# Patient Record
Sex: Female | Born: 1990 | Race: White | Hispanic: No | Marital: Single | Smoking: Current every day smoker
Health system: Southern US, Community
[De-identification: ages and names within clinical notes are randomized; demographics above are authoritative.]

## PROBLEM LIST (undated history)

## (undated) ENCOUNTER — Ambulatory Visit: Admission: EM | Discharge status: Absent without leave | Payer: Medicaid Other

## (undated) DIAGNOSIS — J45909 Unspecified asthma, uncomplicated: Secondary | ICD-10-CM

## (undated) DIAGNOSIS — N390 Urinary tract infection, site not specified: Secondary | ICD-10-CM

## (undated) DIAGNOSIS — F419 Anxiety disorder, unspecified: Secondary | ICD-10-CM

## (undated) DIAGNOSIS — F4310 Post-traumatic stress disorder, unspecified: Secondary | ICD-10-CM

## (undated) DIAGNOSIS — F1111 Opioid abuse, in remission: Secondary | ICD-10-CM

## (undated) HISTORY — PX: FEMUR CLOSED REDUCTION: SHX939

---
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
## 2014-09-02 ENCOUNTER — Encounter (HOSPITAL_COMMUNITY): Payer: Self-pay | Admitting: Emergency Medicine

## 2014-09-02 ENCOUNTER — Emergency Department (HOSPITAL_COMMUNITY)
Admission: EM | Admit: 2014-09-02 | Discharge: 2014-09-03 | Disposition: A | Discharge status: Home / Self Care | Payer: Medicaid Other | Attending: Emergency Medicine | Admitting: Emergency Medicine

## 2014-09-02 DIAGNOSIS — Z880 Allergy status to penicillin: Secondary | ICD-10-CM | POA: Diagnosis not present

## 2014-09-02 DIAGNOSIS — R300 Dysuria: Secondary | ICD-10-CM | POA: Diagnosis present

## 2014-09-02 DIAGNOSIS — N390 Urinary tract infection, site not specified: Secondary | ICD-10-CM | POA: Diagnosis not present

## 2014-09-02 DIAGNOSIS — Z720 Tobacco use: Secondary | ICD-10-CM | POA: Insufficient documentation

## 2014-09-02 LAB — CBC WITH DIFFERENTIAL/PLATELET
BASOS ABS: 0 10*3/uL (ref 0.0–0.1)
BASOS PCT: 0 % (ref 0–1)
EOS ABS: 0.2 10*3/uL (ref 0.0–0.7)
Eosinophils Relative: 2 % (ref 0–5)
HCT: 36.9 % (ref 36.0–46.0)
Hemoglobin: 12.3 g/dL (ref 12.0–15.0)
Lymphocytes Relative: 31 % (ref 12–46)
Lymphs Abs: 2.7 10*3/uL (ref 0.7–4.0)
MCH: 31.2 pg (ref 26.0–34.0)
MCHC: 33.3 g/dL (ref 30.0–36.0)
MCV: 93.7 fL (ref 78.0–100.0)
Monocytes Absolute: 0.5 10*3/uL (ref 0.1–1.0)
Monocytes Relative: 5 % (ref 3–12)
NEUTROS ABS: 5.4 10*3/uL (ref 1.7–7.7)
NEUTROS PCT: 62 % (ref 43–77)
PLATELETS: 184 10*3/uL (ref 150–400)
RBC: 3.94 MIL/uL (ref 3.87–5.11)
RDW: 12.7 % (ref 11.5–15.5)
WBC: 8.7 10*3/uL (ref 4.0–10.5)

## 2014-09-02 LAB — URINE MICROSCOPIC-ADD ON

## 2014-09-02 LAB — URINALYSIS, ROUTINE W REFLEX MICROSCOPIC
Glucose, UA: NEGATIVE mg/dL
Hgb urine dipstick: NEGATIVE
Ketones, ur: 40 mg/dL — AB
Nitrite: POSITIVE — AB
PROTEIN: 30 mg/dL — AB
Specific Gravity, Urine: 1.023 (ref 1.005–1.030)
UROBILINOGEN UA: 4 mg/dL — AB (ref 0.0–1.0)
pH: 5 (ref 5.0–8.0)

## 2014-09-02 LAB — I-STAT CHEM 8, ED
BUN: 11 mg/dL (ref 6–23)
Calcium, Ion: 1.17 mmol/L (ref 1.12–1.23)
Chloride: 102 mmol/L (ref 96–112)
Creatinine, Ser: 0.7 mg/dL (ref 0.50–1.10)
Glucose, Bld: 93 mg/dL (ref 70–99)
HEMATOCRIT: 37 % (ref 36.0–46.0)
HEMOGLOBIN: 12.6 g/dL (ref 12.0–15.0)
POTASSIUM: 3.6 mmol/L (ref 3.5–5.1)
SODIUM: 140 mmol/L (ref 135–145)
TCO2: 20 mmol/L (ref 0–100)

## 2014-09-02 MED ORDER — ONDANSETRON HCL 4 MG/2ML IJ SOLN
4.0000 mg | Freq: Once | INTRAMUSCULAR | Status: AC
  Administered 2014-09-02: 4 mg via INTRAVENOUS
  Filled 2014-09-02: qty 2

## 2014-09-02 MED ORDER — SODIUM CHLORIDE 0.9 % IV SOLN
Freq: Once | INTRAVENOUS | Status: AC
  Administered 2014-09-02: via INTRAVENOUS

## 2014-09-02 MED ORDER — MORPHINE SULFATE 4 MG/ML IJ SOLN
4.0000 mg | Freq: Once | INTRAMUSCULAR | Status: AC
  Administered 2014-09-02: 4 mg via INTRAVENOUS
  Filled 2014-09-02: qty 1

## 2014-09-02 NOTE — ED Notes (Signed)
Pt states that she feels like she has had a UTI for a week and now she is having pain in her back and is fearful of a kidney infection. Alert and oriented.

## 2014-09-02 NOTE — ED Provider Notes (Signed)
CSN: [id number]     Arrival date & time 09/02/14  2206 History  This chart was scribed for non-physician practitioner, Earley FavorGail Shelda Truby, NP-C working with Devoria AlbeIva Knapp, MD, by Abel PrestoKara Demonbreun, ED Scribe. This patient was seen in room WA19/WA19 and the patient's care was started at 11:31 PM.      Chief Complaint  Patient presents with  . Dysuria     Patient is a 24 y.o. female presenting with dysuria. The history is provided by the patient. No language interpreter was used.  Dysuria Associated symptoms: no fever, no nausea, no vaginal discharge and no vomiting    HPI Comments: Jessica Mercer is a 24 y.o. female who presents to the Emergency Department complaining of dysuria with onset 1 week ago. Pt has been taking OTC Azo and drinking fluids for relief. Pt has developed associated back pain in last 24 hours. Pt denies nausea, diarrhea, constipation, vaginal bleeding, vaginal discharge, fever, and headache.   No past medical history on file. Past Surgical History  Procedure Laterality Date  . Femur closed reduction     No family history on file. History  Substance Use Topics  . Smoking status: Current Every Day Smoker  . Smokeless tobacco: Not on file  . Alcohol Use: Yes     Comment: occasionally   OB History    No data available     Review of Systems  Constitutional: Negative for fever.  Gastrointestinal: Negative for nausea, vomiting and diarrhea.  Genitourinary: Positive for dysuria and frequency. Negative for vaginal bleeding and vaginal discharge.  Neurological: Negative for headaches.  All other systems reviewed and are negative.     Allergies  Amoxicillin and Penicillins  Home Medications   Prior to Admission medications   Not on File   BP 107/74 mmHg  Pulse 79  Temp(Src) 98.4 F (36.9 C) (Oral)  Resp 16  SpO2 99%  LMP 08/19/2014 (Approximate) Physical Exam  Constitutional: She is oriented to person, place, and time. She appears well-developed and  well-nourished.  HENT:  Head: Normocephalic.  Eyes: Conjunctivae are normal.  Neck: Normal range of motion. Neck supple.  Cardiovascular: Normal rate, regular rhythm and normal heart sounds.  Exam reveals no friction rub.   No murmur heard. Pulmonary/Chest: Effort normal and breath sounds normal. No respiratory distress. She has no wheezes. She has no rales.  Abdominal: Soft. Bowel sounds are normal. There is CVA tenderness (bialterally).  Musculoskeletal: Normal range of motion.  Neurological: She is alert and oriented to person, place, and time.  Skin: Skin is warm and dry.  Psychiatric: She has a normal mood and affect. Her behavior is normal.  Nursing note and vitals reviewed.   ED Course  Procedures (including critical care time) DIAGNOSTIC STUDIES: Oxygen Saturation is 97% on room air, normal by my interpretation.    COORDINATION OF CARE: 11:31 PM Discussed treatment plan with patient at beside, the patient agrees with the plan and has no further questions at this time.   Labs Review Labs Reviewed  URINALYSIS, ROUTINE W REFLEX MICROSCOPIC  CBC WITH DIFFERENTIAL/PLATELET  I-STAT CHEM 8, ED    Imaging Review No results found.   EKG Interpretation None     Agents labs reviewed.  No elevated white count, normal kidney function.  She does have a urinary tract infection on her urine sample.  She will be started on sulfa due to her penicillin allergy as well as Pyridium for comfort control to be given a Pharmacist, hospitalresource list.  She doesn't  have a PCP MDM   Final diagnoses:  None    Will do IV fluids, I-Stat, CBC, and test kidney function due to new onset of CVA tenderness and with h/o of polynephritis    Earley Favor, NP 09/03/14 0104  Devoria Albe, MD 09/03/14 4098

## 2014-09-03 MED ORDER — FLUCONAZOLE 150 MG PO TABS
150.0000 mg | ORAL_TABLET | Freq: Once | ORAL | Status: AC
  Administered 2014-09-03: 150 mg via ORAL
  Filled 2014-09-03: qty 1

## 2014-09-03 MED ORDER — SULFAMETHOXAZOLE-TRIMETHOPRIM 800-160 MG PO TABS
1.0000 | ORAL_TABLET | Freq: Once | ORAL | Status: AC
  Administered 2014-09-03: 1 via ORAL
  Filled 2014-09-03: qty 1

## 2014-09-03 MED ORDER — PHENAZOPYRIDINE HCL 200 MG PO TABS: 200.0000 mg | ORAL_TABLET | Freq: Three times a day (TID) | ORAL | Status: AC

## 2014-09-03 MED ORDER — PHENAZOPYRIDINE HCL 200 MG PO TABS
200.0000 mg | ORAL_TABLET | Freq: Three times a day (TID) | ORAL | Status: DC
  Administered 2014-09-03: 200 mg via ORAL
  Filled 2014-09-03: qty 1

## 2014-09-03 MED ORDER — SULFAMETHOXAZOLE-TRIMETHOPRIM 800-160 MG PO TABS: 1.0000 | ORAL_TABLET | Freq: Two times a day (BID) | ORAL | Status: DC

## 2014-09-03 MED ORDER — TRAMADOL HCL 50 MG PO TABS
50.0000 mg | ORAL_TABLET | Freq: Four times a day (QID) | ORAL | Status: DC | PRN
Start: 2014-09-03 — End: 2015-09-25

## 2014-09-03 NOTE — Discharge Instructions (Signed)
Antibiotic Medication Antibiotic medicine helps fight germs. Germs cause infections. This type of medicine will not work for colds, flu, or other viral infections. Tell your doctor if you:  Are allergic to any medicines.  Are pregnant or are trying to get pregnant.  Are taking other medicines.  Have other medical problems. HOME CARE  Take your medicine with a glass of water or food as told by your doctor.  Take the medicine as told. Finish them even if you start to feel better.  Do not give your medicine to other people.  Do not use your medicine in the future for a different infection.  Ask your doctor about which side effects to watch for.  Try not to miss any doses. If you miss a dose, take it as soon as possible. If it is almost time for your next dose, and your dosing schedule is:  Two doses a day, take the missed dose and the next dose 5 to 6 hours later.  Three or more doses a day, take the missed dose and the next dose 2 to 4 hours later, or double your next dose.  Then go back to your normal schedule. GET HELP RIGHT AWAY IF:   You get worse or do not get better within a few days.  The medicine makes you sick.  You develop a rash or any other side effects.  You have questions or concerns. MAKE SURE YOU:  Understand these instructions.  Will watch your condition.  Will get help right away if you are not doing well or get worse. Document Released: 03/08/2008 Document Revised: 08/22/2011 Document Reviewed: 05/05/2009 [facility] Patient Information 2015 [place], [place]. This information is not intended to replace advice given to you by your health care provider. Make sure you discuss any questions you have with your health care provider. You do not have an elevated white count and your kidney function is normal.  You do have a urinary tract infection.  You  been given prescriptions for sulfa.  Please take this as directed until completed, as well as Pyridium, which  will make her urine orange color, but it will help with your discomfort    Emergency Department Resource Guide 1) Find a Doctor and Pay Out of Pocket Although you won't have to find out who is covered by your insurance plan, it is a good idea to ask around and get recommendations. You will then need to call the office and see if the doctor you have chosen will accept you as a new patient and what types of options they offer for patients who are self-pay. Some doctors offer discounts or will set up payment plans for their patients who do not have insurance, but you will need to ask so you aren't surprised when you get to your appointment.  2) Contact Your Local Health Department Not all health departments have doctors that can see patients for sick visits, but many do, so it is worth a call to see if yours does. If you don't know where your local health department is, you can check in your phone book. The CDC also has a tool to help you locate your state's health department, and many state websites also have listings of all of their local health departments.  3) Find a Walk-in Clinic If your illness is not likely to be very severe or complicated, you may want to try a walk in clinic. These are popping up all over the country in pharmacies, drugstores, and  shopping centers. They're usually staffed by nurse practitioners or physician assistants that have been trained to treat common illnesses and complaints. They're usually fairly quick and inexpensive. However, if you have serious medical issues or chronic medical problems, these are probably not your best option.  No Primary Care Doctor: - Call Health Connect at  [phone] - they can help you locate a primary care doctor that  accepts your insurance, provides certain services, etc. - Physician Referral Service- [phone]  Chronic Pain Problems: Organization         Address  Phone   Notes  [facility]  [phone]  Patients need to be referred by their primary care doctor.   Medication Assistance: Organization         Address  Phone   Notes  [facility] Medication [facility] [address] [place] [place], [place] [address] [phone] --Must be a resident of [facility] -- Must have NO insurance coverage whatsoever (no Medicaid/ Medicare, etc.) -- The pt. MUST have a primary care doctor that directs their care regularly and follows them in the community   MedAssist  [phone]   [employer]  [phone]    Agencies that provide inexpensive medical care: Organization         Address  Phone   Notes  [facility]  [phone]   [facility]    [phone]   [facility] [address] [place], [place] [address] [phone]   [facility] [place] [facility] [address] [place], [place] [phone]   Planned Parenthood    [phone]   [facility]    [phone]   [facility]  [address], [place] Phone:  [phone], Fax:  [phone] Hours of Operation:  9 am - 6 pm, M-F.  Also accepts Medicaid/Medicare and self-pay.  [facility]  [address], [place] Phone: [phone], Fax: [phone]. Hours of Operation:  8:30 am - 5:30 pm, M-F.  Also accepts Medicaid and self-pay.  [facility] [place] [address] [place] Phone: [phone]   [facility] [address] Natasha Bence [place], [place] [phone], Ext. [phone] Mondays & Thursdays: 7-9 AM.  First 15 patients are seen on a first come, first serve basis.    Medicaid-accepting [facility] Providers:  Organization         Address  Phone   Notes  [facility] [address], [place] [phone] Also accepts self-pay patients.  [facility] [address] Laurell Josephs [place], [place]  [phone]   [facility] [address], [place] [phone]   [facility] [address], [place] [phone]   Renaye Rakers [address], [place]   [phone] Only accepts [place] Access [place] patients after they have their name applied to their card.   Self-Pay (no insurance) in [facility]:  Organization         Address  Phone   Notes  Sickle Cell Patients, [facility] Internal Medicine [address] [place], [place] [phone]   [facility] [address] [place], [place] [phone]   [facility] [place]  [address], [place] [phone]  [facility]/Dr. Osei-Bonsu  [address] [place] or [address], [place] [phone] Phone number for both [place] and [place] locations is the same.  [facility]gent Medical and [facility] [address], [place] [phone]   [facility] [address], [place] or [address] [phone]   [facility] [address], [place] [phone], phone; [phone], fax Sees patients 1st and 3rd Saturday of every month.  Must not qualify for public or private insurance (i.e. Medicaid, Medicare, [place] Health Choice, Veterans' Benefits)  Household income should be no more than 200% of the poverty level The clinic cannot treat you if you are pregnant or think you are pregnant  Sexually transmitted diseases are not treated at the clinic.    Dental Care: Organization         Address  Phone  Notes  [facility] of [facility] [address] [place], [place] [phone] Accepts children up to age 70 who are enrolled in [place] or [place] Health Choice; pregnant women with a Medicaid card; and children who have applied for Medicaid or [place] Health Choice, but were  declined, whose parents can pay a reduced fee at time of service.  [facility] Department of [facility]  [address], [place] [phone] Accepts children up to age 37 who are enrolled in [place] or [place] Health Choice; pregnant women with a Medicaid card; and children who have applied for Medicaid or [place] Health Choice, but were declined, whose parents can pay a reduced fee at time of service.  [facility]  [address] [place], [place] [phone] Patients are seen by appointment only. Walk-ins are not accepted. [facility] will see patients 85 years of age and older. Monday - Tuesday (8am-5pm) Most Wednesdays (8:30-5pm) $30 per visit, cash only  [facility]  [address], [facility] [phone] Patients are seen by appointment only. Walk-ins are not accepted. [facility] will see patients 63 years of age and older. One Wednesday Evening (Monthly: Volunteer Based).  $30 per visit, cash only  [employer]  [phone] for adults; Children under age 81, call Graduate Pediatric Dentistry at [phone]. Children aged 30-14, please call [phone] to request a pediatric application.  Dental services are provided in all areas of dental care including fillings, crowns and bridges, complete and partial dentures, implants, gum treatment, root canals, and extractions. Preventive care is also provided. Treatment is provided to both adults and children. Patients are selected via a lottery and there is often a waiting list.   [facility] [address] [place]  [phone] [email or web address]   [facility] [address] [place], [place] [phone], Ext. [phone] Second and Fourth Thursday of each month, opens at 6:30 AM; Clinic ends at 9 AM.  Patients are seen on a first-come first-served basis, and a limited number are seen during each clinic.    [facility]  [address] Ether Griffins [place], [place] [phone]   Eligibility Requirements You must have lived in [place], [place], or [place] counties for at least the last three months.   You cannot be eligible for state or federal sponsored [place], including [employer], [place], or [employer].   You generally cannot be eligible for healthcare insurance  through your employer.    How to apply: Eligibility screenings are held every Tuesday and Wednesday afternoon from 1:00 pm until 4:00 pm. You do not need an appointment for the interview!  [facility] [address], [place], [place] [phone]   [facility]  [phone]   [facility] Health Department  [phone]   [facility] Health Department  [phone]    Behavioral Health Resources in the Community: Intensive Outpatient Programs Organization         Address  Phone  Notes  [facility] Services [address], [place], [place] [phone]   [facility] Outpatient [address] [place], [place] [phone]   [facility] [address], [place], [place]  [phone]   [facility] Mental Health [address],  [place], [place] [phone] or [phone]   Substance Abuse Resources Organization         Address  Phone  Notes  [facility]  [phone]   [facility]  [phone]   [facility]  [phone]   Floydene Flock  [phone]   Residential & Outpatient Substance Abuse Program  [phone]   Psychological Services Organization         Address  Phone  Notes  [facility]  [phone]   [facility] Services  [phone]   [facility] Mental Health [address] [place] [phone] or [phone]    Mobile Crisis Teams Organization         Address  Phone  Notes  [facility], Mobile Crisis Care  Unit  [phone]   [facility]  [address] [place], [place] [phone]   Doristine Locks [address] [place] [phone]    Self-Help/Support Groups Organization         Address  Phone             Notes  [facility] [place] - variety of support groups  336- I7437963 Call for more information  Narcotics Anonymous (NA), [facility] [address], [employer] [place]  2 meetings at this location   Statistician         Address  Phone  Notes  [facility] [address],    [place]  [phone]   [facility]  [address], [place] [address], [place], [place] [phone]   [facility] [address] [place], [place] [phone] Admissions: 8am-3pm M-F  [facility] [address],    [place], [place] [phone]   [facility] [address] [place], [place], [place] [phone]   [facility] [address]  [place], [place] [phone]   [facility] - Intensive Outpatient [address] Laurell Josephs [address], [place], [place] [phone]   [facility] ([facility] [address] [place]  [place], [place] [phone] or [phone]   [facility] ([facility]) [address] [place], [place] [phone] Accepts Medicaid  [facility] [address],  [place] [phone] Substance Abuse/Addiction Treatment   [facility] Organization         Address  Phone  Notes  [facility]  [phone]   Angie Fava, PhD [address] [place], [place]   3088747331 or [phone]   [facility]   [address] [place], [place] [phone]  [facility] [address], [place], [place] [phone]) [phone] Insurance/Medicaid/sponsorship through [employer]  [facility] [address]                                     [place], [place] [phone]) [phone] Therapy/tele-psych/case  [facility] [address]   [place], [place] [phone]    Dr. Lolly MustacheArfeen  [phone]   [facility] [place]  [employer] [facility] 1) [address], [place] 2) [address], [place] 3)  [address], [place] [phone]) [phone]) [phone]  [phone](336) [phone]   [facility] Child Abuse Hotline [phone](336) [phone] or [phone](336) [phone] (After Hours)

## 2015-02-07 ENCOUNTER — Emergency Department (HOSPITAL_BASED_OUTPATIENT_CLINIC_OR_DEPARTMENT_OTHER)
Admission: EM | Admit: 2015-02-07 | Discharge: 2015-02-07 | Disposition: A | Discharge status: Home / Self Care | Payer: Medicaid Other | Attending: Physician Assistant | Admitting: Physician Assistant

## 2015-02-07 ENCOUNTER — Encounter (HOSPITAL_BASED_OUTPATIENT_CLINIC_OR_DEPARTMENT_OTHER): Payer: Self-pay | Admitting: *Deleted

## 2015-02-07 DIAGNOSIS — Z880 Allergy status to penicillin: Secondary | ICD-10-CM | POA: Insufficient documentation

## 2015-02-07 DIAGNOSIS — Z792 Long term (current) use of antibiotics: Secondary | ICD-10-CM | POA: Diagnosis not present

## 2015-02-07 DIAGNOSIS — R300 Dysuria: Secondary | ICD-10-CM | POA: Diagnosis present

## 2015-02-07 DIAGNOSIS — Z79899 Other long term (current) drug therapy: Secondary | ICD-10-CM | POA: Diagnosis not present

## 2015-02-07 DIAGNOSIS — M545 Low back pain, unspecified: Secondary | ICD-10-CM

## 2015-02-07 DIAGNOSIS — Z87440 Personal history of urinary (tract) infections: Secondary | ICD-10-CM | POA: Insufficient documentation

## 2015-02-07 DIAGNOSIS — Z720 Tobacco use: Secondary | ICD-10-CM | POA: Insufficient documentation

## 2015-02-07 DIAGNOSIS — Z3202 Encounter for pregnancy test, result negative: Secondary | ICD-10-CM | POA: Diagnosis not present

## 2015-02-07 DIAGNOSIS — F419 Anxiety disorder, unspecified: Secondary | ICD-10-CM | POA: Diagnosis not present

## 2015-02-07 HISTORY — DX: Urinary tract infection, site not specified: N39.0

## 2015-02-07 HISTORY — DX: Anxiety disorder, unspecified: F41.9

## 2015-02-07 HISTORY — DX: Post-traumatic stress disorder, unspecified: F43.10

## 2015-02-07 LAB — URINALYSIS, ROUTINE W REFLEX MICROSCOPIC
Bilirubin Urine: NEGATIVE
Glucose, UA: NEGATIVE mg/dL
Hgb urine dipstick: NEGATIVE
KETONES UR: NEGATIVE mg/dL
LEUKOCYTES UA: NEGATIVE
NITRITE: NEGATIVE
PH: 5.5 (ref 5.0–8.0)
PROTEIN: NEGATIVE mg/dL
Specific Gravity, Urine: 1.024 (ref 1.005–1.030)
Urobilinogen, UA: 0.2 mg/dL (ref 0.0–1.0)

## 2015-02-07 LAB — PREGNANCY, URINE: Preg Test, Ur: NEGATIVE

## 2015-02-07 MED ORDER — OXYCODONE-ACETAMINOPHEN 5-325 MG PO TABS
1.0000 | ORAL_TABLET | Freq: Once | ORAL | Status: AC
Start: 2015-02-07 — End: 2015-02-07
  Administered 2015-02-07: 1 via ORAL
  Filled 2015-02-07: qty 1

## 2015-02-07 NOTE — ED Notes (Signed)
Reports hx of frequent UTIs. Has been having sx since 8/5. Saw PCP on 8/18 and was started on cipro- states sx did not resolve so saw PCP yesterday and was given Rx for bactrim which she has not been able to fill yet- pt reports flank pain and left side abd pain

## 2015-02-07 NOTE — Discharge Instructions (Signed)

## 2015-02-07 NOTE — ED Notes (Signed)
Pt called for triage- in bathroom at this time

## 2015-02-07 NOTE — ED Provider Notes (Signed)
CSN: [id number]     Arrival date & time 02/07/15  2047 History  This chart was scribed for Keilee Denman Randall An, MD by [name], ED Scribe. This patient was seen in room MH11/MH11 and the patient's care was started at 9:37 PM.    Chief Complaint  Patient presents with  . Dysuria  . Flank Pain   The history is provided by the patient. No language interpreter was used.   HPI Comments: Jessica Mercer is a 24 y.o. female with a PMHx of frequent UTIs who presents to the Emergency Department complaining of dysuria and constant, stabbing bilateral flank pain onset 8/5. She notes that the pain to the left flank is worse than the right. She reports associated cramping, left-sided groin pain onset this morning. Pt was seen for the same by her PCP on 8/18, when she was prescribed Cipro. She returned to the PCP 1 day ago, as the medication had provided no relief or improvement of the symptoms. She was then prescribed bactrim, but states that she has not been able to fill the prescription yet. She states her PCP is having yesterday's urine sample to be tested for STDs and will refer her to a urologist if pains continue. She notes she takes ibuprofen every day for HA, with no relief to the pain. Pt denies chunky discharge.  Past Medical History  Diagnosis Date  . Frequent UTI   . Anxiety   . PTSD (post-traumatic stress disorder)    Past Surgical History  Procedure Laterality Date  . Femur closed reduction     No family history on file. Social History  Substance Use Topics  . Smoking status: Current Every Day Smoker    Types: Cigarettes  . Smokeless tobacco: Never Used  . Alcohol Use: Yes     Comment: 2 beer/ week   OB History    No data available     Review of Systems  Gastrointestinal: Positive for abdominal pain.  Genitourinary: Positive for dysuria and flank pain. Negative for vaginal discharge.  All other systems reviewed and are negative.   Allergies  Amoxicillin and  Penicillins  Home Medications   Prior to Admission medications   Medication Sig Start Date End Date Taking? Authorizing Provider  ALPRAZolam Prudy Feeler) 1 MG tablet Take 0.5-1 mg by mouth 2 (two) times daily.  08/14/14  Yes Historical Provider, MD  PREVIDENT 5000 BOOSTER PLUS 1.1 % PSTE Take 1 application by mouth 3 (three) times daily as needed (for teeth enamel).  08/14/14   Historical Provider, MD  sulfamethoxazole-trimethoprim (BACTRIM DS,SEPTRA DS) 800-160 MG per tablet Take 1 tablet by mouth 2 (two) times daily. 09/03/14   Earley Favor, NP  traMADol (ULTRAM) 50 MG tablet Take 1 tablet (50 mg total) by mouth every 6 (six) hours as needed. 09/03/14   Earley Favor, NP  TRI-PREVIFEM 0.18/0.215/0.25 MG-35 MCG tablet Take 1 tablet by mouth daily. 08/12/14   Historical Provider, MD   BP 122/86 mmHg  Pulse 85  Temp(Src) 98.2 F (36.8 C) (Oral)  Resp 18  Ht  (1.575 m)  Wt 138 lb (62.596 kg)  BMI 25.23 kg/m2  SpO2 100%  LMP 01/11/2015 Physical Exam  Constitutional: She is oriented to person, place, and time. She appears well-developed and well-nourished.  HENT:  Head: Normocephalic and atraumatic.  Eyes: Conjunctivae are normal. Right eye exhibits no discharge. Left eye exhibits no discharge.  Pulmonary/Chest: Effort normal. No respiratory distress.  Abdominal: Soft. There is no tenderness.  Musculoskeletal: She  exhibits no tenderness.  No CVA TTP  Neurological: She is alert and oriented to person, place, and time. Coordination normal.  Skin: Skin is warm and dry. No rash noted. She is not diaphoretic. No erythema.  Psychiatric: She has a normal mood and affect.  Nursing note and vitals reviewed.   ED Course  Procedures  DIAGNOSTIC STUDIES: Oxygen Saturation is 100% on RA, normal by my interpretation.    COORDINATION OF CARE: 9:46 PM - Discussed normal urinalysis results and possible STD or small kidney stones. Discussed plans to give a note for work and order a dose of percocet. Pt  advised of plan for treatment and pt agrees.  Labs Review Labs Reviewed  URINALYSIS, ROUTINE W REFLEX MICROSCOPIC (NOT AT [facility])  PREGNANCY, URINE    Imaging Review No results found. I have personally reviewed and evaluated these images and lab results as part of my medical decision-making.   EKG Interpretation None      MDM   Final diagnoses:  None    Patient is a 24 year old female presenting with back pain. She is requesting pain medication. She states that she was given Percocet by her primary care doctor last week and she ran out and she requested more and patient's primary care provider only gave her ibuprofen. She states that she is sure that her kidney hurts because of infection. Patient has no evidence of urinary tract infection. Patient's urine already sent for GC chlamydia by her primary care provider. We talked about other etiologies of back pain including kidney stone. However she looks so well on exam and has no tenderness and has normal vital signs I do not believe that she has a obstructive stone at this time. We will have her follow-up with her primary care provider as planned. We will give her 1 Percocet here to be comfortable but will not be  prescribing any narcotics at this time.  I personally performed the services described in this documentation, which was scribed in my presence. The recorded information has been reviewed and is accurate.   Dariella Gillihan Randall An, MD 02/07/15 2311

## 2015-06-15 ENCOUNTER — Emergency Department (HOSPITAL_COMMUNITY)
Admission: EM | Admit: 2015-06-15 | Discharge: 2015-06-15 | Disposition: A | Discharge status: Home / Self Care | Payer: Medicaid Other | Attending: Emergency Medicine | Admitting: Emergency Medicine

## 2015-06-15 ENCOUNTER — Encounter (HOSPITAL_COMMUNITY): Payer: Self-pay

## 2015-06-15 ENCOUNTER — Emergency Department (HOSPITAL_COMMUNITY): Payer: Medicaid Other

## 2015-06-15 DIAGNOSIS — M79652 Pain in left thigh: Secondary | ICD-10-CM

## 2015-06-15 DIAGNOSIS — W1839XA Other fall on same level, initial encounter: Secondary | ICD-10-CM | POA: Insufficient documentation

## 2015-06-15 DIAGNOSIS — Z79818 Long term (current) use of other agents affecting estrogen receptors and estrogen levels: Secondary | ICD-10-CM | POA: Insufficient documentation

## 2015-06-15 DIAGNOSIS — Y998 Other external cause status: Secondary | ICD-10-CM | POA: Insufficient documentation

## 2015-06-15 DIAGNOSIS — F419 Anxiety disorder, unspecified: Secondary | ICD-10-CM | POA: Insufficient documentation

## 2015-06-15 DIAGNOSIS — S79912A Unspecified injury of left hip, initial encounter: Secondary | ICD-10-CM | POA: Insufficient documentation

## 2015-06-15 DIAGNOSIS — M25552 Pain in left hip: Secondary | ICD-10-CM

## 2015-06-15 DIAGNOSIS — W19XXXA Unspecified fall, initial encounter: Secondary | ICD-10-CM

## 2015-06-15 DIAGNOSIS — Z792 Long term (current) use of antibiotics: Secondary | ICD-10-CM | POA: Insufficient documentation

## 2015-06-15 DIAGNOSIS — Y9389 Activity, other specified: Secondary | ICD-10-CM | POA: Insufficient documentation

## 2015-06-15 DIAGNOSIS — S79922A Unspecified injury of left thigh, initial encounter: Secondary | ICD-10-CM | POA: Insufficient documentation

## 2015-06-15 DIAGNOSIS — Y9289 Other specified places as the place of occurrence of the external cause: Secondary | ICD-10-CM | POA: Insufficient documentation

## 2015-06-15 DIAGNOSIS — Z9889 Other specified postprocedural states: Secondary | ICD-10-CM | POA: Insufficient documentation

## 2015-06-15 DIAGNOSIS — G8929 Other chronic pain: Secondary | ICD-10-CM | POA: Insufficient documentation

## 2015-06-15 DIAGNOSIS — Z880 Allergy status to penicillin: Secondary | ICD-10-CM | POA: Insufficient documentation

## 2015-06-15 DIAGNOSIS — F17210 Nicotine dependence, cigarettes, uncomplicated: Secondary | ICD-10-CM | POA: Insufficient documentation

## 2015-06-15 DIAGNOSIS — Z87440 Personal history of urinary (tract) infections: Secondary | ICD-10-CM | POA: Insufficient documentation

## 2015-06-15 DIAGNOSIS — Z79899 Other long term (current) drug therapy: Secondary | ICD-10-CM | POA: Insufficient documentation

## 2015-06-15 DIAGNOSIS — M25562 Pain in left knee: Secondary | ICD-10-CM

## 2015-06-15 NOTE — ED Notes (Signed)
Patient jhere with ongoing left leg pain. Patient states that she thinks it is related to the screws she still has in femur from old injury. Has been seen for same by other MD;s per patient

## 2015-06-15 NOTE — ED Notes (Signed)
PA at bedside updating patient.
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
## 2015-06-15 NOTE — Discharge Instructions (Signed)
Joint Pain °Joint pain, which is also called arthralgia, can be caused by many things. Joint pain often goes away when you follow your health care provider's instructions for relieving pain at home. However, joint pain can also be caused by conditions that require further treatment. Common causes of joint pain include: °· Bruising in the area of the joint. °· Overuse of the joint. °· Wear and tear on the joints that occur with aging (osteoarthritis). °· Various other forms of arthritis. °· A buildup of a crystal form of uric acid in the joint (gout). °· Infections of the joint (septic arthritis) or of the bone (osteomyelitis). °Your health care provider may recommend medicine to help with the pain. If your joint pain continues, additional tests may be needed to diagnose your condition. °HOME CARE INSTRUCTIONS °Watch your condition for any changes. Follow these instructions as directed to lessen the pain that you are feeling. °· Take medicines only as directed by your health care provider. °· Rest the affected area for as long as your health care provider says that you should. If directed to do so, raise the painful joint above the level of your heart while you are sitting or lying down. °· Do not do things that cause or worsen pain. °· If directed, apply ice to the painful area: °¨ Put ice in a plastic bag. °¨ Place a towel between your skin and the bag. °¨ Leave the ice on for 20 minutes, 2-3 times per day. °· Wear an elastic bandage, splint, or sling as directed by your health care provider. Loosen the elastic bandage or splint if your fingers or toes become numb and tingle, or if they turn cold and blue. °· Begin exercising or stretching the affected area as directed by your health care provider. Ask your health care provider what types of exercise are safe for you. °· Keep all follow-up visits as directed by your health care provider. This is important. °SEEK MEDICAL CARE IF: °· Your pain increases, and medicine  does not help. °· Your joint pain does not improve within 3 days. °· You have increased bruising or swelling. °· You have a fever. °· You lose 10 lb (4.5 kg) or more without trying. °SEEK IMMEDIATE MEDICAL CARE IF: °· You are not able to move the joint. °· Your fingers or toes become numb or they turn cold and blue. °  °This information is not intended to replace advice given to you by your health care provider. Make sure you discuss any questions you have with your health care provider. °  °Document Released: 05/30/2005 Document Revised: 06/20/2014 Document Reviewed: 03/11/2014 °Elsevier Interactive Patient Education ©2016 Elsevier Inc. ° ° °Emergency Department Resource Guide °1) Find a Doctor and Pay Out of Pocket °Although you won't have to find out who is covered by your insurance plan, it is a good idea to ask around and get recommendations. You will then need to call the office and see if the doctor you have chosen will accept you as a new patient and what types of options they offer for patients who are self-pay. Some doctors offer discounts or will set up payment plans for their patients who do not have insurance, but you will need to ask so you aren't surprised when you get to your appointment. ° °2) Contact Your Local Health Department °Not all health departments have doctors that can see patients for sick visits, but many do, so it is worth a call to see if yours   does. If you don't know where your local health department is, you can check in your phone book. The CDC also has a tool to help you locate your state's health department, and many state websites also have listings of all of their local health departments. ° °3) Find a Walk-in Clinic °If your illness is not likely to be very severe or complicated, you may want to try a walk in clinic. These are popping up all over the country in pharmacies, drugstores, and shopping centers. They're usually staffed by nurse practitioners or physician assistants  that have been trained to treat common illnesses and complaints. They're usually fairly quick and inexpensive. However, if you have serious medical issues or chronic medical problems, these are probably not your best option. ° °No Primary Care Doctor: °- Call [facility] at  832-8000 - they can help you locate a primary care doctor that  accepts your insurance, provides certain services, etc. °- Physician Referral Service- 1-800-533-3463 ° °Chronic Pain Problems: °Organization         Address  Phone   Notes  °[facility]  (336) 297-2271 Patients need to be referred by their primary care doctor.  ° °Medication Assistance: °Organization         Address  Phone   Notes  °[place] [facility] [address] °[place], [place] [address] (336) 641-8030 --Must be a resident of [place] °-- Must have NO insurance coverage whatsoever (no Medicaid/ Medicare, etc.) °-- The pt. MUST have a primary care doctor that directs their care regularly and follows them in the community °  °[facility]  (866) 331-1348   °[employer]  (888) 892-1162   ° °Agencies that provide inexpensive medical care: °Organization         Address  Phone   Notes  °[facility]  (336) 832-8035   °[facility]    (336) 832-7272   °[facility] [address] °[place], [place] [address] (336) 832-4777   °[facility] [address], °[place] (336) 271-4999   °[facility]    (336) 373-0678   °[facility]    (336) 272-1050   °[facility] ° [address], [place] Phone:  (336) 832-4444, Fax:  (336) 832-4440 Hours of Operation:  9 am - 6 pm, M-F.  Also accepts Medicaid/Medicare and self-pay.  °[facility] ° [address], [place] Phone: (336) 832-3150, Fax: (336) [phone]. Hours of Operation:  8:30 am - 5:30 pm, M-F.  Also accepts Medicaid  and self-pay.  °[facility] [place] [address], [place] Phone: (336) 878-6027   °[facility] [address], [place], [place] (336)723-1848, Ext. 123 Mondays & Thursdays: 7-9 AM.  First 15 patients are seen on a first come, first serve basis. °  ° °Medicaid-accepting [place] Providers: ° °Organization         Address  Phone   Notes  °[facility] [address], [place] (336) 641-2100 Also accepts self-pay patients.  °[facility] [address], [place] ° (336) 856-9996   °[facility] [address], [place] (336) 288-8857   °[facility] [address] [place] [address], [place] (336) 299-7000   °Veita Bland [address], [place]  ° (336) 373-1557   Only accepts [place] Access Medicaid patients after they have their name applied to their card.  ° °Self-Pay (no insurance) in [place]: ° °Organization         Address  Phone   Notes  °Sickle Cell Patients, [facility] [address], [place] (336) 832-1970   °[facility] [address], [place] (336) 832-4400   ° [facility] ° [address], [place] (336) 992-4800   °[facility]/Dr. Osei-Bonsu ° [address] [place] [address], [place] or [address], [place] (336) 841-8500 Phone number for both [place] and [place] locations is the same.  °[facility] [address], [place] (336) 299-0000   °[facility] [address] [place] [address], [place] or [address] (336) 852-7530 °(336) 878-2260   °[facility] [address], [place] (336) 350-1642, phone; (336) 294-5005, fax Sees patients 1st and 3rd Saturday of every month.  Must not qualify for public or private insurance (i.e. Medicaid, Medicare, [place] Health Choice, Veterans' Benefits) • Household income  should be no more than 200% of the poverty level •The clinic cannot treat you if you are pregnant or think you are pregnant • Sexually transmitted diseases are not treated at the clinic.  ° ° °Dental Care: °Organization         Address  Phone  Notes  °[facility] [address], [place] (336) 641-6152 Accepts children up to age 21 who are enrolled in Medicaid or [place] Health Choice; pregnant women with a Medicaid card; and children who have applied for Medicaid or [place] Health Choice, but were declined, whose parents can pay a reduced fee at time of service.  °[facility] [place]  [address], [place] (336) 641-7733 Accepts children up to age 21 who are enrolled in Medicaid or [place] Health Choice; pregnant women with a Medicaid card; and children who have applied for Medicaid or [place] Health Choice, but were declined, whose parents can pay a reduced fee at time of service.  °[facility] ° [address], [place] (336) 641-4533 Patients are seen by appointment only. Walk-ins are not accepted. [facility] will see patients 18 years of age and older. °Monday - Tuesday (8am-5pm) °Most Wednesdays (8:30-5pm) °$30 per visit, cash only  °[facility] ° [address], [place] (336) 641-4533 Patients are seen by appointment only. Walk-ins are not accepted. [facility] will see patients 18 years of age and older. °One Wednesday Evening (Monthly: Volunteer Based).  $30 per visit, cash only  °[facility]  (919) 537-3737 for adults; Children under age 4, call Graduate Pediatric Dentistry at ([phone]) [phone]. Children aged 4-14, please call (919) [phone] to request a pediatric application. ° Dental services are provided in all areas of dental care including fillings, crowns and bridges, complete and partial dentures, implants, gum treatment,  root canals, and extractions. Preventive care is also provided. Treatment is provided to both adults and children. °Patients are selected via a lottery and there is often a waiting list. °  °[facility] [address], ° ° (336) 763-8833 [email or web address] °  °[facility] [address], [place],  (336)723-1848, Ext. 123 Second and Fourth Thursday of each month, opens at 6:30 AM; Clinic   ends at 9 AM.  Patients are seen on a first-come first-served basis, and a limited number are seen during each clinic.  ° °[facility] ° [address], [place], [place] (336) 723-7904   Eligibility Requirements °You must have lived in [place], [place], or [place] counties for at least the last three months. °  You cannot be eligible for state or federal sponsored healthcare insurance, including Veterans Administration, Medicaid, or Medicare. °  You generally cannot be eligible for healthcare insurance through your employer.  °  How to apply: °Eligibility screenings are held every Tuesday and Wednesday afternoon from 1:00 pm until 4:00 pm. You do not need an appointment for the interview!  °[facility] [address], [place], [place] 336-631-2330   °[facility]  336-342-8273   °[place] [facility]  336-703-3100   °[facility]  336-570-6415   ° °Behavioral Health Resources in the Community: °Intensive Outpatient Programs °Organization         Address  Phone  Notes  °[facility] [address], [place], [place] 336-878-6098   °[facility] [address], [place], [place] 336-832-9800   °[facility] [address], [place], [place] ° 336-882-2125   °[facility] [address],  °[place], [place] 1-800-853-5163 or 336-641-4981   °Substance Abuse Resources °Organization         Address  Phone  Notes  °[facility]   336-882-2125   °[facility]  336-784-9470   °[facility]  336-285-9073   °[facility]  336-845-3988   °Residential & Outpatient Substance Abuse Program  1-800-659-3381   °Psychological Services °Organization         Address  Phone  Notes  ° Health  336- 832-9600   °[facility]  336- 378-7881   °[facility] [address], [place] 1-800-853-5163 or 336-641-4981   ° °Mobile Crisis Teams °Organization         Address  Phone  Notes  °[facility], Mobile Crisis Care Unit  1-877-626-1772   °Assertive °[facility] ° [address] [place], [place] 336-834-9664   °Sharon DeEsch [address] °[place] 336-554-5454   ° °Self-Help/Support Groups °Organization         Address  Phone             Notes  °[facility] [place] - variety of support groups  336- 373-1402 Call for more information  °Narcotics Anonymous (NA), [facility] [address], °[place]  2 meetings at this location  ° °Residential Treatment Programs °Organization         Address  Phone  Notes  °[facility] [address],    °[place]  1-866-801-8205   °[facility] ° [address], [place], [place] 704-293-8524   °[facility] [address], [place] 336-845-3988 Admissions: 8am-3pm M-F  °[facility] [address]    °[place], [place] 336-841-1104   °[facility] [address], [place], [place] 336-379-7146   °[facility] [address]  °[place], [place] 336-285-9073   °[facility] Intensive Outpatient [address], [place], [place] 336-852-3033   °[facility] [address]  °[place],  1-877-615-2722 or 336-784-9470   °[facility] [address] [place], [place] 336-227-7417 Accepts Medicaid  °[facility] [address]  °[place] 1-800-659-3381 Substance Abuse/Addiction Treatment  ° °[place] Behavioral Health Resources °Organization         Address  Phone  Notes  °[facility]  (888) 581-9988   °Julie Brannon, PhD [address] [place], [place]   (336) 349-5553 or (336) 951-0000   °[facility]   [address] °[place], [place] (336) 349-4454   °[facility] [address], [place], [place] (336) 342-8316 Insurance/Medicaid/sponsorship through [facility]  °[facility] [address]                                    [place], [place] (336) 342-8316 Therapy/tele-psych/case  °[facility] [address]  ° [place], [place] (336) 349-2233    °Dr. Arfeen  (336) 349-4544   °[facility] [place]  [employer] [place] [facility] 1) [address], [place] °2) [address], [place] °3)  [address], [place] (336) 349-3220 °(336) 342-7768 ° °(336) 342-8140   °[place] [facility] (336) 342-1394 or (336) 342-3537 (After Hours)    ° ° ° °

## 2015-06-15 NOTE — ED Notes (Signed)
Pt able to ambulate independently 

## 2015-07-07 ENCOUNTER — Encounter (HOSPITAL_BASED_OUTPATIENT_CLINIC_OR_DEPARTMENT_OTHER): Payer: Self-pay

## 2015-07-07 ENCOUNTER — Emergency Department (HOSPITAL_BASED_OUTPATIENT_CLINIC_OR_DEPARTMENT_OTHER)
Admission: EM | Admit: 2015-07-07 | Discharge: 2015-07-07 | Disposition: A | Discharge status: Home / Self Care | Payer: Medicaid Other | Attending: Emergency Medicine | Admitting: Emergency Medicine

## 2015-07-07 DIAGNOSIS — J069 Acute upper respiratory infection, unspecified: Secondary | ICD-10-CM

## 2015-07-07 DIAGNOSIS — Z7951 Long term (current) use of inhaled steroids: Secondary | ICD-10-CM | POA: Insufficient documentation

## 2015-07-07 DIAGNOSIS — Z872 Personal history of diseases of the skin and subcutaneous tissue: Secondary | ICD-10-CM | POA: Insufficient documentation

## 2015-07-07 DIAGNOSIS — F17210 Nicotine dependence, cigarettes, uncomplicated: Secondary | ICD-10-CM | POA: Insufficient documentation

## 2015-07-07 DIAGNOSIS — Z79899 Other long term (current) drug therapy: Secondary | ICD-10-CM | POA: Insufficient documentation

## 2015-07-07 DIAGNOSIS — Z87440 Personal history of urinary (tract) infections: Secondary | ICD-10-CM | POA: Insufficient documentation

## 2015-07-07 DIAGNOSIS — M791 Myalgia: Secondary | ICD-10-CM | POA: Insufficient documentation

## 2015-07-07 DIAGNOSIS — Z880 Allergy status to penicillin: Secondary | ICD-10-CM | POA: Insufficient documentation

## 2015-07-07 MED ORDER — BENZONATATE 100 MG PO CAPS: 100.0000 mg | ORAL_CAPSULE | Freq: Three times a day (TID) | ORAL | Status: DC

## 2015-07-07 MED ORDER — FLUTICASONE PROPIONATE 50 MCG/ACT NA SUSP: NASAL | Status: DC

## 2015-07-07 MED ORDER — HYDROCODONE-ACETAMINOPHEN 5-325 MG PO TABS
1.0000 | ORAL_TABLET | Freq: Once | ORAL | Status: AC
Start: 2015-07-07 — End: 2015-07-07
  Administered 2015-07-07: 1 via ORAL
  Filled 2015-07-07: qty 1

## 2015-07-07 NOTE — ED Notes (Signed)
Recheck temp 98.4 orally and pt states she has a metallic taste in mouth.

## 2015-07-07 NOTE — Discharge Instructions (Signed)
Upper Respiratory Infection, Adult Most upper respiratory infections (URIs) are a viral infection of the air passages leading to the lungs. A URI affects the nose, throat, and upper air passages. The most common type of URI is nasopharyngitis and is typically referred to as "the common cold." URIs run their course and usually go away on their own. Most of the time, a URI does not require medical attention, but sometimes a bacterial infection in the upper airways can follow a viral infection. This is called a secondary infection. Sinus and middle ear infections are common types of secondary upper respiratory infections. Bacterial pneumonia can also complicate a URI. A URI can worsen asthma and chronic obstructive pulmonary disease (COPD). Sometimes, these complications can require emergency medical care and may be life threatening.  CAUSES Almost all URIs are caused by viruses. A virus is a type of germ and can spread from one person to another.  RISKS FACTORS You may be at risk for a URI if:   You smoke.   You have chronic heart or lung disease.  You have a weakened defense (immune) system.   You are very young or very old.   You have nasal allergies or asthma.  You work in crowded or poorly ventilated areas.  You work in health care facilities or schools. SIGNS AND SYMPTOMS  Symptoms typically develop 2-3 days after you come in contact with a cold virus. Most viral URIs last 7-10 days. However, viral URIs from the influenza virus (flu virus) can last 14-18 days and are typically more severe. Symptoms may include:   Runny or stuffy (congested) nose.   Sneezing.   Cough.   Sore throat.   Headache.   Fatigue.   Fever.   Loss of appetite.   Pain in your forehead, behind your eyes, and over your cheekbones (sinus pain).  Muscle aches.  DIAGNOSIS  Your health care provider may diagnose a URI by:  Physical exam.  Tests to check that your symptoms are not due to  another condition such as:  Strep throat.  Sinusitis.  Pneumonia.  Asthma. TREATMENT  A URI goes away on its own with time. It cannot be cured with medicines, but medicines may be prescribed or recommended to relieve symptoms. Medicines may help:  Reduce your fever.  Reduce your cough.  Relieve nasal congestion. HOME CARE INSTRUCTIONS   Take medicines only as directed by your health care provider.   Gargle warm saltwater or take cough drops to comfort your throat as directed by your health care provider.  Use a warm mist humidifier or inhale steam from a shower to increase air moisture. This may make it easier to breathe.  Drink enough fluid to keep your urine clear or pale yellow.   Eat soups and other clear broths and maintain good nutrition.   Rest as needed.   Return to work when your temperature has returned to normal or as your health care provider advises. You may need to stay home longer to avoid infecting others. You can also use a face mask and careful hand washing to prevent spread of the virus.  Increase the usage of your inhaler if you have asthma.   Do not use any tobacco products, including cigarettes, chewing tobacco, or electronic cigarettes. If you need help quitting, ask your health care provider. PREVENTION  The best way to protect yourself from getting a cold is to practice good hygiene.   Avoid oral or hand contact with people with cold   symptoms.   Wash your hands often if contact occurs.  There is no clear evidence that vitamin C, vitamin E, echinacea, or exercise reduces the chance of developing a cold. However, it is always recommended to get plenty of rest, exercise, and practice good nutrition.  SEEK MEDICAL CARE IF:   You are getting worse rather than better.   Your symptoms are not controlled by medicine.   You have chills.  You have worsening shortness of breath.  You have brown or red mucus.  You have yellow or brown nasal  discharge.  You have pain in your face, especially when you bend forward.  You have a fever.  You have swollen neck glands.  You have pain while swallowing.  You have white areas in the back of your throat. SEEK IMMEDIATE MEDICAL CARE IF:   You have severe or persistent:  Headache.  Ear pain.  Sinus pain.  Chest pain.  You have chronic lung disease and any of the following:  Wheezing.  Prolonged cough.  Coughing up blood.  A change in your usual mucus.  You have a stiff neck.  You have changes in your:  Vision.  Hearing.  Thinking.  Mood. MAKE SURE YOU:   Understand these instructions.  Will watch your condition.  Will get help right away if you are not doing well or get worse.   This information is not intended to replace advice given to you by your health care provider. Make sure you discuss any questions you have with your health care provider.   Document Released: 11/23/2000 Document Revised: 10/14/2014 Document Reviewed: 09/04/2013 Elsevier Interactive Patient Education 2016 [employer]  

## 2015-07-07 NOTE — ED Notes (Signed)
Pt given d/c instructions as per chart. Verbalizes understanding. No questions. 

## 2015-07-07 NOTE — ED Notes (Signed)
Pt c/o nasal congestion, body aches, chills and a sore to her lip that is not relieved by OTC medications.

## 2015-07-07 NOTE — ED Notes (Signed)
MD at bedside. 

## 2015-07-07 NOTE — ED Notes (Signed)
Pt states she has had body aches and cold sweats since Friday. Also states she bit her lip and daughter later hit it. Swelling and clr drainage from same since Sun. Has been applying ice as well.

## 2015-07-07 NOTE — ED Provider Notes (Signed)
CSN: [id number]     Arrival date & time 07/07/15  2029 History  By signing my name below, I, Marisue Humble, attest that this documentation has been prepared under the direction and in the presence of Rolland Porter, MD . Electronically Signed: Marisue Humble, Scribe. 07/07/2015. 10:38 PM.    Chief Complaint  Patient presents with  . Nasal Congestion   The history is provided by the patient and a friend. No language interpreter was used.   HPI Comments:  Jessica Mercer is a 25 y.o. female with a PMHx of childhood asthma who presents to the Emergency Department complaining of moderate nasal congestion onset five days ago. She reports associated generalized body aches, cough, and rhinorrhea. Pt states she has been taking Ibuprofen for aches with mild relief. Pt has not had a flu vaccine this season. She denies chest pain with breathing, nausea, vomiting, and pain in sinuses.   Pt also c/o moderate pain, and mild bleeding to lower lip after biting her lip.  Past Medical History  Diagnosis Date  . Frequent UTI   . Anxiety   . PTSD (post-traumatic stress disorder)    Past Surgical History  Procedure Laterality Date  . Femur closed reduction     No family history on file. Social History  Substance Use Topics  . Smoking status: Current Every Day Smoker    Types: Cigarettes  . Smokeless tobacco: Never Used  . Alcohol Use: Yes     Comment: 2 beer/ week   OB History    No data available     Review of Systems  Constitutional: Negative for fever, chills, diaphoresis, appetite change and fatigue.  HENT: Positive for congestion and rhinorrhea. Negative for mouth sores, sinus pressure, sore throat and trouble swallowing.   Eyes: Negative for visual disturbance.  Respiratory: Positive for cough. Negative for chest tightness, shortness of breath and wheezing.   Cardiovascular: Negative for chest pain.  Gastrointestinal: Negative for nausea, vomiting, abdominal pain, diarrhea and  abdominal distention.  Endocrine: Negative for polydipsia, polyphagia and polyuria.  Genitourinary: Negative for dysuria, frequency and hematuria.  Musculoskeletal: Positive for myalgias (generalized). Negative for gait problem.  Skin: Negative for color change, pallor and rash.  Neurological: Negative for dizziness, syncope, light-headedness and headaches.  Hematological: Does not bruise/bleed easily.  Psychiatric/Behavioral: Negative for behavioral problems and confusion.   Allergies  Amoxicillin and Penicillins  Home Medications   Prior to Admission medications   Medication Sig Start Date End Date Taking? Authorizing Provider  ALPRAZolam Prudy Feeler) 1 MG tablet Take 0.5-1 mg by mouth 2 (two) times daily.  08/14/14  Yes Historical Provider, MD  benzonatate (TESSALON) 100 MG capsule Take 1 capsule (100 mg total) by mouth every 8 (eight) hours. 07/07/15   Rolland Porter, MD  fluticasone Aleda Grana) 50 MCG/ACT nasal spray 1 spray each nares twice a day 07/07/15   Rolland Porter, MD  PREVIDENT 5000 BOOSTER PLUS 1.1 % PSTE Take 1 application by mouth 3 (three) times daily as needed (for teeth enamel).  08/14/14   Historical Provider, MD  sulfamethoxazole-trimethoprim (BACTRIM DS,SEPTRA DS) 800-160 MG per tablet Take 1 tablet by mouth 2 (two) times daily. 09/03/14   Earley Favor, NP  traMADol (ULTRAM) 50 MG tablet Take 1 tablet (50 mg total) by mouth every 6 (six) hours as needed. 09/03/14   Earley Favor, NP  TRI-PREVIFEM 0.18/0.215/0.25 MG-35 MCG tablet Take 1 tablet by mouth daily. 08/12/14   Historical Provider, MD   BP 119/85 mmHg  Pulse 90  Temp(Src) 98.7 F (37.1 C) (Oral)  Resp 16  Ht  (1.575 m)  Wt 137 lb (62.143 kg)  BMI 25.05 kg/m2  SpO2 98%  LMP 06/08/2015   Physical Exam  Constitutional: She is oriented to person, place, and time. She appears well-developed and well-nourished. No distress.  HENT:  Head: Normocephalic.  Injection conjunctiva  Eyes: Conjunctivae are normal. Pupils are equal,  round, and reactive to light. No scleral icterus.  Neck: Normal range of motion. Neck supple. No thyromegaly present.  Cardiovascular: Normal rate and regular rhythm.  Exam reveals no gallop and no friction rub.   No murmur heard. Pulmonary/Chest: Effort normal and breath sounds normal. No respiratory distress. She has no wheezes. She has no rales.  Abdominal: Soft. Bowel sounds are normal. She exhibits no distension. There is no tenderness. There is no rebound.  Musculoskeletal: Normal range of motion.  Neurological: She is alert and oriented to person, place, and time.  Skin: Skin is warm and dry. No rash noted.  Psychiatric: She has a normal mood and affect. Her behavior is normal.    ED Course  Procedures  DIAGNOSTIC STUDIES: Oxygen Saturation is 97% on RA, normal by my interpretation.    COORDINATION OF CARE: 10:23 PM Will give cough medicine and OTC pain medication. Discussed treatment plan with pt at bedside and pt agreed to plan.  Labs Review Labs Reviewed - No data to display  Imaging Review No results found.   EKG Interpretation None      MDM   Final diagnoses:  URI (upper respiratory infection)    I personally performed the services described in this documentation, which was scribed in my presence. The recorded information has been reviewed and is accurate.    Rolland Porter, MD 07/15/15 [phone]

## 2015-08-30 ENCOUNTER — Emergency Department (HOSPITAL_BASED_OUTPATIENT_CLINIC_OR_DEPARTMENT_OTHER)
Admission: EM | Admit: 2015-08-30 | Discharge: 2015-08-30 | Disposition: A | Discharge status: Home / Self Care | Payer: Medicaid Other | Attending: Emergency Medicine | Admitting: Emergency Medicine

## 2015-08-30 ENCOUNTER — Encounter (HOSPITAL_BASED_OUTPATIENT_CLINIC_OR_DEPARTMENT_OTHER): Payer: Self-pay | Admitting: *Deleted

## 2015-08-30 DIAGNOSIS — N760 Acute vaginitis: Secondary | ICD-10-CM | POA: Insufficient documentation

## 2015-08-30 DIAGNOSIS — Z87440 Personal history of urinary (tract) infections: Secondary | ICD-10-CM | POA: Insufficient documentation

## 2015-08-30 DIAGNOSIS — Z3202 Encounter for pregnancy test, result negative: Secondary | ICD-10-CM | POA: Insufficient documentation

## 2015-08-30 DIAGNOSIS — F17210 Nicotine dependence, cigarettes, uncomplicated: Secondary | ICD-10-CM | POA: Insufficient documentation

## 2015-08-30 DIAGNOSIS — F419 Anxiety disorder, unspecified: Secondary | ICD-10-CM | POA: Insufficient documentation

## 2015-08-30 DIAGNOSIS — Z7951 Long term (current) use of inhaled steroids: Secondary | ICD-10-CM | POA: Insufficient documentation

## 2015-08-30 DIAGNOSIS — Z880 Allergy status to penicillin: Secondary | ICD-10-CM | POA: Insufficient documentation

## 2015-08-30 DIAGNOSIS — B9689 Other specified bacterial agents as the cause of diseases classified elsewhere: Secondary | ICD-10-CM

## 2015-08-30 DIAGNOSIS — Z79899 Other long term (current) drug therapy: Secondary | ICD-10-CM | POA: Insufficient documentation

## 2015-08-30 LAB — URINALYSIS, ROUTINE W REFLEX MICROSCOPIC
BILIRUBIN URINE: NEGATIVE
Glucose, UA: NEGATIVE mg/dL
Hgb urine dipstick: NEGATIVE
Ketones, ur: NEGATIVE mg/dL
Leukocytes, UA: NEGATIVE
NITRITE: NEGATIVE
PH: 6.5 (ref 5.0–8.0)
Protein, ur: NEGATIVE mg/dL
SPECIFIC GRAVITY, URINE: 1.02 (ref 1.005–1.030)

## 2015-08-30 LAB — WET PREP, GENITAL
SPERM: NONE SEEN
TRICH WET PREP: NONE SEEN
Yeast Wet Prep HPF POC: NONE SEEN

## 2015-08-30 LAB — PREGNANCY, URINE: Preg Test, Ur: NEGATIVE

## 2015-08-30 MED ORDER — LIDOCAINE HCL (PF) 1 % IJ SOLN
INTRAMUSCULAR | Status: AC
  Administered 2015-08-30: 5 mL
  Filled 2015-08-30: qty 5

## 2015-08-30 MED ORDER — CEFTRIAXONE SODIUM 250 MG IJ SOLR
250.0000 mg | Freq: Once | INTRAMUSCULAR | Status: AC
  Administered 2015-08-30: 250 mg via INTRAMUSCULAR
  Filled 2015-08-30: qty 250

## 2015-08-30 MED ORDER — METRONIDAZOLE 500 MG PO TABS
500.0000 mg | ORAL_TABLET | Freq: Once | ORAL | Status: AC
Start: 2015-08-30 — End: 2015-08-30
  Administered 2015-08-30: 500 mg via ORAL
  Filled 2015-08-30: qty 1

## 2015-08-30 MED ORDER — FLUCONAZOLE 150 MG PO TABS: 150.0000 mg | ORAL_TABLET | Freq: Once | ORAL | Status: DC | PRN

## 2015-08-30 MED ORDER — METRONIDAZOLE 500 MG PO TABS: 500.0000 mg | ORAL_TABLET | Freq: Three times a day (TID) | ORAL | Status: DC

## 2015-08-30 MED ORDER — AZITHROMYCIN 1 G PO PACK
1.0000 g | PACK | Freq: Once | ORAL | Status: AC
  Administered 2015-08-30: 1 g via ORAL
  Filled 2015-08-30: qty 1

## 2015-08-30 NOTE — ED Notes (Signed)
Vaginal discharge with odor x 3 days

## 2015-08-30 NOTE — ED Provider Notes (Signed)
CSN: [id number]     Arrival date & time 08/30/15  1847 History  By signing my name below, I, Jessica Mercer, attest that this documentation has been prepared under the direction and in the presence of [name], MD. Electronically Signed: Budd Mercer, ED Scribe. 08/30/2015. 8:59 PM.     Chief Complaint  Patient presents with  . Vaginal Discharge   The history is provided by the patient. No language interpreter was used.   HPI Comments: Jessica Mercer is a 25 y.o. female smoker with a PMHx of frequent UTI who presents to the Emergency Department complaining of worsening malodorous vaginal discharge onset 2 days ago. She reports associated bilateral flank pain. She notes a PMHx of bacterial vaginosis. She states she frequently gets UTI's and believes this may be due to not drinking any water. She also notes that her vaginal discharge will "randomly" change in scent for no apparent reason ever since she had her daughter nearly 2 years ago. She notes she "just came off of [her] period." She is sexually active with multiple female partners, with whom she uses protection. She is not aware of any of her partners having STD's, but states that her current partner has multiple other sexual partners besides herself. She notes the last time she was checked for HIV or syphilis was 2 years ago while she was pregnant with her daughter. Pt denies rash, vaginal bleeding, dysuria, and hematuria.  Pt is allergic to penicillins and amoxicillin.   Past Medical History  Diagnosis Date  . Frequent UTI   . Anxiety   . PTSD (post-traumatic stress disorder)    Past Surgical History  Procedure Laterality Date  . Femur closed reduction     No family history on file. Social History  Substance Use Topics  . Smoking status: Current Every Day Smoker    Types: Cigarettes  . Smokeless tobacco: Never Used  . Alcohol Use: Yes     Comment: 2 beer/ week   OB History    No data available     Review of Systems   Genitourinary: Positive for vaginal discharge. Negative for dysuria, hematuria and vaginal bleeding.  Skin: Negative for rash.  All other systems reviewed and are negative.   Allergies  Amoxicillin and Penicillins  Home Medications   Prior to Admission medications   Medication Sig Start Date End Date Taking? Authorizing Provider  ALPRAZolam Prudy Feeler) 1 MG tablet Take 0.5-1 mg by mouth 2 (two) times daily.  08/14/14  Yes Historical Provider, MD  traMADol (ULTRAM) 50 MG tablet Take 1 tablet (50 mg total) by mouth every 6 (six) hours as needed. 09/03/14  Yes Earley Favor, NP  benzonatate (TESSALON) 100 MG capsule Take 1 capsule (100 mg total) by mouth every 8 (eight) hours. 07/07/15   Rolland Porter, MD  fluconazole (DIFLUCAN) 150 MG tablet Take 1 tablet (150 mg total) by mouth once as needed (s/s yeast infection). 08/30/15   [name], MD  fluticasone Aleda Grana) 50 MCG/ACT nasal spray 1 spray each nares twice a day 07/07/15   Rolland Porter, MD  metroNIDAZOLE (FLAGYL) 500 MG tablet Take 1 tablet (500 mg total) by mouth 3 (three) times daily. 08/30/15   [name], MD  PREVIDENT 5000 BOOSTER PLUS 1.1 % PSTE Take 1 application by mouth 3 (three) times daily as needed (for teeth enamel).  08/14/14   Historical Provider, MD  sulfamethoxazole-trimethoprim (BACTRIM DS,SEPTRA DS) 800-160 MG per tablet Take 1 tablet by mouth 2 (two) times daily. 09/03/14  Earley FavorGail Schulz, NP  TRI-PREVIFEM 0.18/0.215/0.25 MG-35 MCG tablet Take 1 tablet by mouth daily. 08/12/14   Historical Provider, MD   BP 114/79 mmHg  Pulse 77  Temp(Src) 98.3 F (36.8 C) (Oral)  Resp 18  Ht 5\' 2"  (1.575 m)  Wt 137 lb (62.143 kg)  BMI 25.05 kg/m2  SpO2 100%  LMP 08/26/2015 Physical Exam  Constitutional: She is oriented to person, place, and time. She appears well-developed and well-nourished.  HENT:  Head: Normocephalic and atraumatic.  Eyes: Conjunctivae are normal. Right eye exhibits no discharge. Left eye exhibits no discharge.   Pulmonary/Chest: Effort normal. No respiratory distress.  Genitourinary: Vaginal discharge found.  Female chaperone present; 2 small areas of swelling just superior to her labia, no TTP or erythema, no warmth. Minimal amount of malodorous discharge, without adnexal masses or TTP. Cervical motion TTP  Musculoskeletal: She exhibits no tenderness.  No CVA or midline back TTP  Neurological: She is alert and oriented to person, place, and time. Coordination normal.  Skin: Skin is warm and dry. No rash noted. She is not diaphoretic. No erythema.  Psychiatric: She has a normal mood and affect.  Nursing note and vitals reviewed.   ED Course  Procedures  DIAGNOSTIC STUDIES: Oxygen Saturation is 98% on RA, normal by my interpretation.    COORDINATION OF CARE: 8:57 PM - Discussed normal urinalysis, performed pelvic exam, and discussed plans to wait on diagnostic studies. Pt advised of plan for treatment and pt agrees.  Labs Review Labs Reviewed  WET PREP, GENITAL - Abnormal; Notable for the following:    Clue Cells Wet Prep HPF POC PRESENT (*)    WBC, Wet Prep HPF POC MODERATE (*)    All other components within normal limits  PREGNANCY, URINE  URINALYSIS, ROUTINE W REFLEX MICROSCOPIC (NOT [facility])  RPR  HIV ANTIBODY (ROUTINE TESTING)  GC/CHLAMYDIA PROBE AMP ([place]) NOT AT [facility]    Imaging Review No results found. I have personally reviewed and evaluated these images and lab results as part of my medical decision-making.   EKG Interpretation None      MDM   Final diagnoses:  Bacterial vaginosis    Vaginal discharge with high risk sexual behavior. tx for STD's. Also tx for BV and given one time dose rx for diflucan as she has h/o getting fungal infections when on abx.   New Prescriptions: Discharge Medication List as of 08/30/2015  9:17 PM    START taking these medications   Details  fluconazole (DIFLUCAN) 150 MG tablet Take 1 tablet (150 mg total) by mouth once as  needed (s/s yeast infection)., Starting 08/30/2015, Until Discontinued, Print    metroNIDAZOLE (FLAGYL) 500 MG tablet Take 1 tablet (500 mg total) by mouth 3 (three) times daily., Starting 08/30/2015, Until Discontinued, Print         I have personally and contemperaneously reviewed labs and imaging and used in my decision making as above.   A medical screening exam was performed and I feel the patient has had an appropriate workup for their chief complaint at this time and likelihood of emergent condition existing is low. Their vital signs are stable. They have been counseled on decision, discharge, follow up and which symptoms necessitate immediate return to the emergency department.  They verbally stated understanding and agreement with plan and discharged in stable condition.    I personally performed the services described in this documentation, which was scribed in my presence. The recorded information has been reviewed  and is accurate.    [name], MD 09/02/15 [phone]

## 2015-08-31 LAB — GC/CHLAMYDIA PROBE AMP (~~LOC~~) NOT AT ARMC
Chlamydia: NEGATIVE
Neisseria Gonorrhea: NEGATIVE

## 2015-09-01 LAB — RPR: RPR Ser Ql: NONREACTIVE

## 2015-09-01 LAB — HIV ANTIBODY (ROUTINE TESTING W REFLEX): HIV Screen 4th Generation wRfx: NONREACTIVE

## 2015-09-25 ENCOUNTER — Emergency Department (HOSPITAL_BASED_OUTPATIENT_CLINIC_OR_DEPARTMENT_OTHER): Payer: Self-pay

## 2015-09-25 ENCOUNTER — Emergency Department (HOSPITAL_BASED_OUTPATIENT_CLINIC_OR_DEPARTMENT_OTHER)
Admission: EM | Admit: 2015-09-25 | Discharge: 2015-09-25 | Disposition: A | Discharge status: Home / Self Care | Payer: Self-pay | Attending: Emergency Medicine | Admitting: Emergency Medicine

## 2015-09-25 ENCOUNTER — Encounter (HOSPITAL_BASED_OUTPATIENT_CLINIC_OR_DEPARTMENT_OTHER): Payer: Self-pay

## 2015-09-25 DIAGNOSIS — Y92410 Unspecified street and highway as the place of occurrence of the external cause: Secondary | ICD-10-CM | POA: Insufficient documentation

## 2015-09-25 DIAGNOSIS — F17210 Nicotine dependence, cigarettes, uncomplicated: Secondary | ICD-10-CM | POA: Insufficient documentation

## 2015-09-25 DIAGNOSIS — T148XXA Other injury of unspecified body region, initial encounter: Secondary | ICD-10-CM

## 2015-09-25 DIAGNOSIS — Z880 Allergy status to penicillin: Secondary | ICD-10-CM | POA: Insufficient documentation

## 2015-09-25 DIAGNOSIS — Z79899 Other long term (current) drug therapy: Secondary | ICD-10-CM | POA: Insufficient documentation

## 2015-09-25 DIAGNOSIS — F419 Anxiety disorder, unspecified: Secondary | ICD-10-CM | POA: Insufficient documentation

## 2015-09-25 DIAGNOSIS — S60222A Contusion of left hand, initial encounter: Secondary | ICD-10-CM | POA: Insufficient documentation

## 2015-09-25 DIAGNOSIS — S79922A Unspecified injury of left thigh, initial encounter: Secondary | ICD-10-CM | POA: Insufficient documentation

## 2015-09-25 DIAGNOSIS — Y9389 Activity, other specified: Secondary | ICD-10-CM | POA: Insufficient documentation

## 2015-09-25 DIAGNOSIS — S7011XA Contusion of right thigh, initial encounter: Secondary | ICD-10-CM | POA: Insufficient documentation

## 2015-09-25 DIAGNOSIS — Y998 Other external cause status: Secondary | ICD-10-CM | POA: Insufficient documentation

## 2015-09-25 DIAGNOSIS — S0083XA Contusion of other part of head, initial encounter: Secondary | ICD-10-CM | POA: Insufficient documentation

## 2015-09-25 DIAGNOSIS — S60212A Contusion of left wrist, initial encounter: Secondary | ICD-10-CM | POA: Insufficient documentation

## 2015-09-25 DIAGNOSIS — Z87440 Personal history of urinary (tract) infections: Secondary | ICD-10-CM | POA: Insufficient documentation

## 2015-09-25 DIAGNOSIS — S199XXA Unspecified injury of neck, initial encounter: Secondary | ICD-10-CM | POA: Insufficient documentation

## 2015-09-25 DIAGNOSIS — J45909 Unspecified asthma, uncomplicated: Secondary | ICD-10-CM | POA: Insufficient documentation

## 2015-09-25 DIAGNOSIS — S29002A Unspecified injury of muscle and tendon of back wall of thorax, initial encounter: Secondary | ICD-10-CM | POA: Insufficient documentation

## 2015-09-25 HISTORY — DX: Unspecified asthma, uncomplicated: J45.909

## 2015-09-25 LAB — COMPREHENSIVE METABOLIC PANEL
ALBUMIN: 3.9 g/dL (ref 3.5–5.0)
ALK PHOS: 47 U/L (ref 38–126)
ALT: 17 U/L (ref 14–54)
ANION GAP: 4 — AB (ref 5–15)
AST: 17 U/L (ref 15–41)
BILIRUBIN TOTAL: 0.5 mg/dL (ref 0.3–1.2)
BUN: 14 mg/dL (ref 6–20)
CALCIUM: 8.7 mg/dL — AB (ref 8.9–10.3)
CO2: 27 mmol/L (ref 22–32)
Chloride: 110 mmol/L (ref 101–111)
Creatinine, Ser: 0.53 mg/dL (ref 0.44–1.00)
GLUCOSE: 96 mg/dL (ref 65–99)
POTASSIUM: 3.4 mmol/L — AB (ref 3.5–5.1)
SODIUM: 141 mmol/L (ref 135–145)
TOTAL PROTEIN: 7 g/dL (ref 6.5–8.1)

## 2015-09-25 LAB — CBC WITH DIFFERENTIAL/PLATELET
BASOS ABS: 0 10*3/uL (ref 0.0–0.1)
BASOS PCT: 0 %
Eosinophils Absolute: 0.2 10*3/uL (ref 0.0–0.7)
Eosinophils Relative: 3 %
HEMATOCRIT: 35.1 % — AB (ref 36.0–46.0)
Hemoglobin: 12 g/dL (ref 12.0–15.0)
LYMPHS PCT: 37 %
Lymphs Abs: 2.8 10*3/uL (ref 0.7–4.0)
MCH: 32.3 pg (ref 26.0–34.0)
MCHC: 34.2 g/dL (ref 30.0–36.0)
MCV: 94.4 fL (ref 78.0–100.0)
MONO ABS: 0.7 10*3/uL (ref 0.1–1.0)
Monocytes Relative: 9 %
NEUTROS ABS: 3.8 10*3/uL (ref 1.7–7.7)
Neutrophils Relative %: 51 %
Platelets: 190 10*3/uL (ref 150–400)
RBC: 3.72 MIL/uL — AB (ref 3.87–5.11)
RDW: 12.4 % (ref 11.5–15.5)
WBC: 7.4 10*3/uL (ref 4.0–10.5)

## 2015-09-25 MED ORDER — KETOROLAC TROMETHAMINE 15 MG/ML IJ SOLN
15.0000 mg | Freq: Once | INTRAMUSCULAR | Status: AC
  Administered 2015-09-25: 15 mg via INTRAVENOUS
  Filled 2015-09-25: qty 1

## 2015-09-25 MED ORDER — FENTANYL CITRATE (PF) 100 MCG/2ML IJ SOLN
50.0000 ug | Freq: Once | INTRAMUSCULAR | Status: AC
  Administered 2015-09-25: 50 ug via INTRAVENOUS
  Filled 2015-09-25: qty 2

## 2015-09-25 MED ORDER — IOPAMIDOL (ISOVUE-300) INJECTION 61%
100.0000 mL | Freq: Once | INTRAVENOUS | Status: AC | PRN
  Administered 2015-09-25: 100 mL via INTRAVENOUS

## 2015-09-25 MED ORDER — SODIUM CHLORIDE 0.9 % IV BOLUS (SEPSIS)
1000.0000 mL | Freq: Once | INTRAVENOUS | Status: AC
  Administered 2015-09-25: 1000 mL via INTRAVENOUS

## 2015-09-25 MED ORDER — CYCLOBENZAPRINE HCL 5 MG PO TABS: 5.0000 mg | ORAL_TABLET | Freq: Two times a day (BID) | ORAL | Status: AC | PRN

## 2015-09-25 NOTE — ED Notes (Signed)
Patient transported to CT 
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
## 2015-09-25 NOTE — ED Notes (Signed)
Pt ambulated w/o difficulty

## 2015-09-25 NOTE — ED Notes (Signed)
Pt able to ambulate with no assistance, seemingly with no problems.

## 2017-01-24 ENCOUNTER — Other Ambulatory Visit (HOSPITAL_COMMUNITY): Payer: Self-pay | Admitting: Specialist

## 2017-01-24 DIAGNOSIS — Z3689 Encounter for other specified antenatal screening: Secondary | ICD-10-CM

## 2017-01-31 ENCOUNTER — Encounter (HOSPITAL_COMMUNITY): Payer: Self-pay | Admitting: *Deleted

## 2017-02-02 ENCOUNTER — Other Ambulatory Visit (HOSPITAL_COMMUNITY): Payer: Self-pay | Admitting: Specialist

## 2017-02-02 ENCOUNTER — Ambulatory Visit (HOSPITAL_COMMUNITY)
Admission: RE | Admit: 2017-02-02 | Discharge: 2017-02-02 | Disposition: A | Discharge status: Home / Self Care | Payer: Medicaid Other | Source: Ambulatory Visit | Attending: Specialist | Admitting: Specialist

## 2017-02-02 ENCOUNTER — Other Ambulatory Visit (HOSPITAL_COMMUNITY): Payer: Self-pay | Admitting: *Deleted

## 2017-02-02 ENCOUNTER — Encounter (HOSPITAL_COMMUNITY): Payer: Self-pay

## 2017-02-02 DIAGNOSIS — J45909 Unspecified asthma, uncomplicated: Secondary | ICD-10-CM | POA: Insufficient documentation

## 2017-02-02 DIAGNOSIS — Z3A25 25 weeks gestation of pregnancy: Secondary | ICD-10-CM | POA: Diagnosis not present

## 2017-02-02 DIAGNOSIS — Z87440 Personal history of urinary (tract) infections: Secondary | ICD-10-CM | POA: Insufficient documentation

## 2017-02-02 DIAGNOSIS — B189 Chronic viral hepatitis, unspecified: Secondary | ICD-10-CM | POA: Diagnosis not present

## 2017-02-02 DIAGNOSIS — Z364 Encounter for antenatal screening for fetal growth retardation: Secondary | ICD-10-CM | POA: Diagnosis not present

## 2017-02-02 DIAGNOSIS — O98412 Viral hepatitis complicating pregnancy, second trimester: Secondary | ICD-10-CM | POA: Insufficient documentation

## 2017-02-02 DIAGNOSIS — Z363 Encounter for antenatal screening for malformations: Secondary | ICD-10-CM | POA: Diagnosis not present

## 2017-02-02 DIAGNOSIS — F1120 Opioid dependence, uncomplicated: Secondary | ICD-10-CM

## 2017-02-02 DIAGNOSIS — O99322 Drug use complicating pregnancy, second trimester: Secondary | ICD-10-CM

## 2017-02-02 DIAGNOSIS — Z3689 Encounter for other specified antenatal screening: Secondary | ICD-10-CM

## 2017-02-02 DIAGNOSIS — F4310 Post-traumatic stress disorder, unspecified: Secondary | ICD-10-CM | POA: Insufficient documentation

## 2017-02-02 DIAGNOSIS — O99512 Diseases of the respiratory system complicating pregnancy, second trimester: Secondary | ICD-10-CM | POA: Insufficient documentation

## 2017-02-02 DIAGNOSIS — F17210 Nicotine dependence, cigarettes, uncomplicated: Secondary | ICD-10-CM | POA: Diagnosis not present

## 2017-02-02 DIAGNOSIS — O99332 Smoking (tobacco) complicating pregnancy, second trimester: Secondary | ICD-10-CM | POA: Diagnosis not present

## 2017-02-02 DIAGNOSIS — O99320 Drug use complicating pregnancy, unspecified trimester: Principal | ICD-10-CM

## 2017-02-02 DIAGNOSIS — O99342 Other mental disorders complicating pregnancy, second trimester: Secondary | ICD-10-CM | POA: Insufficient documentation
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
## 2017-03-01 ENCOUNTER — Encounter (HOSPITAL_COMMUNITY): Payer: Self-pay

## 2017-03-02 ENCOUNTER — Ambulatory Visit (HOSPITAL_COMMUNITY): Payer: Medicaid Other

## 2017-03-02 ENCOUNTER — Ambulatory Visit (HOSPITAL_COMMUNITY)
Admission: RE | Admit: 2017-03-02 | Discharge: 2017-03-02 | Disposition: A | Discharge status: Home / Self Care | Payer: Medicaid Other | Source: Ambulatory Visit | Attending: Specialist | Admitting: Specialist

## 2017-03-02 ENCOUNTER — Encounter (HOSPITAL_COMMUNITY): Payer: Self-pay

## 2017-03-02 DIAGNOSIS — Z3A29 29 weeks gestation of pregnancy: Secondary | ICD-10-CM

## 2017-03-02 DIAGNOSIS — O99320 Drug use complicating pregnancy, unspecified trimester: Secondary | ICD-10-CM

## 2017-03-02 DIAGNOSIS — Z362 Encounter for other antenatal screening follow-up: Secondary | ICD-10-CM | POA: Diagnosis present

## 2017-03-02 DIAGNOSIS — F1120 Opioid dependence, uncomplicated: Secondary | ICD-10-CM

## 2017-03-02 DIAGNOSIS — B182 Chronic viral hepatitis C: Secondary | ICD-10-CM | POA: Insufficient documentation

## 2017-03-02 DIAGNOSIS — O98413 Viral hepatitis complicating pregnancy, third trimester: Secondary | ICD-10-CM | POA: Insufficient documentation

## 2017-03-02 DIAGNOSIS — O99323 Drug use complicating pregnancy, third trimester: Secondary | ICD-10-CM | POA: Insufficient documentation

## 2017-03-02 HISTORY — DX: Opioid abuse, in remission: F11.11

## 2017-03-02 NOTE — Addendum Note (Signed)
Encounter addended by: Lenoard Aden, RDMS on: 03/02/2017  3:49 PM<BR>    Actions taken: Imaging Exam ended

## 2017-03-02 NOTE — Progress Notes (Signed)
Patient comes in today for repeat evaluation. She is now back on subutex, taking between 2 and 4 mg per day. She is reporting no other problems with the pregnancy. She has checked with Dr. Tawni Levy office and there is no problems with her delivering at the local hospital. Growth on the [place] today is stable. A repeat US in 4 to 6 weeks is suggested. Than can be performed in Dr. Tawni Levy office as that is much more convenient for the patient.  15 minutes was spent with the patient and >50% of the time was spent in face-to-face counseling and planning

## 2017-03-30 ENCOUNTER — Ambulatory Visit (HOSPITAL_COMMUNITY): Payer: Medicaid Other

## 2017-03-31 ENCOUNTER — Other Ambulatory Visit (HOSPITAL_COMMUNITY): Payer: Self-pay | Admitting: Specialist

## 2017-03-31 DIAGNOSIS — Z3A34 34 weeks gestation of pregnancy: Secondary | ICD-10-CM

## 2017-03-31 DIAGNOSIS — Z3689 Encounter for other specified antenatal screening: Secondary | ICD-10-CM

## 2017-03-31 DIAGNOSIS — O365930 Maternal care for other known or suspected poor fetal growth, third trimester, not applicable or unspecified: Secondary | ICD-10-CM

## 2017-04-06 ENCOUNTER — Encounter (HOSPITAL_COMMUNITY): Payer: Self-pay

## 2017-04-07 ENCOUNTER — Other Ambulatory Visit (HOSPITAL_COMMUNITY): Payer: Self-pay | Admitting: Specialist

## 2017-04-07 ENCOUNTER — Ambulatory Visit (HOSPITAL_COMMUNITY): Admission: RE | Admit: 2017-04-07 | Payer: Medicaid Other | Source: Ambulatory Visit

## 2017-04-07 ENCOUNTER — Encounter (HOSPITAL_COMMUNITY): Payer: Self-pay

## 2017-04-07 ENCOUNTER — Ambulatory Visit (HOSPITAL_COMMUNITY)
Admission: RE | Admit: 2017-04-07 | Discharge: 2017-04-07 | Disposition: A | Discharge status: Home / Self Care | Payer: Medicaid Other | Source: Ambulatory Visit | Attending: Specialist | Admitting: Specialist

## 2017-04-07 DIAGNOSIS — Z3689 Encounter for other specified antenatal screening: Secondary | ICD-10-CM | POA: Diagnosis not present

## 2017-04-07 DIAGNOSIS — Z3A34 34 weeks gestation of pregnancy: Secondary | ICD-10-CM

## 2017-04-07 DIAGNOSIS — O99323 Drug use complicating pregnancy, third trimester: Secondary | ICD-10-CM | POA: Insufficient documentation

## 2017-04-07 DIAGNOSIS — B182 Chronic viral hepatitis C: Secondary | ICD-10-CM | POA: Diagnosis not present

## 2017-04-07 DIAGNOSIS — O98413 Viral hepatitis complicating pregnancy, third trimester: Secondary | ICD-10-CM | POA: Insufficient documentation

## 2017-04-07 DIAGNOSIS — F1120 Opioid dependence, uncomplicated: Secondary | ICD-10-CM | POA: Insufficient documentation

## 2017-04-07 DIAGNOSIS — O365930 Maternal care for other known or suspected poor fetal growth, third trimester, not applicable or unspecified: Secondary | ICD-10-CM

## 2017-04-07 HISTORY — DX: Opioid abuse, in remission: F11.11

## 2017-04-24 MED ORDER — ONDANSETRON HCL 4 MG/2ML IJ SOLN
4.00 mg | INTRAMUSCULAR | Status: DC
Start: ? — End: 2017-04-24

## 2017-04-24 MED ORDER — BUPRENORPHINE HCL 2 MG SL SUBL
2.00 mg | SUBLINGUAL_TABLET | SUBLINGUAL | Status: DC
Start: 2017-04-24 — End: 2017-04-24

## 2017-04-24 MED ORDER — OXYCODONE-ACETAMINOPHEN 5-325 MG PO TABS
1.00 | ORAL_TABLET | ORAL | Status: DC
Start: ? — End: 2017-04-24

## 2017-04-24 MED ORDER — BENZOCAINE-MENTHOL 6-10 MG MT LOZG
1.00 | LOZENGE | OROMUCOSAL | Status: DC
Start: ? — End: 2017-04-24

## 2017-04-24 MED ORDER — NALOXONE HCL 0.4 MG/ML IJ SOLN
0.08 mg | INTRAMUSCULAR | Status: DC
Start: ? — End: 2017-04-24

## 2017-04-24 MED ORDER — MEASLES, MUMPS & RUBELLA VAC ~~LOC~~ INJ
.50 mL | INJECTION | SUBCUTANEOUS | Status: DC
Start: ? — End: 2017-04-24

## 2017-04-24 MED ORDER — IRON PO
1.00 | ORAL | Status: DC
Start: 2017-04-25 — End: 2017-04-24

## 2017-04-24 MED ORDER — MISOPROSTOL 100 MCG PO TABS
50.00 | ORAL_TABLET | ORAL | Status: DC
Start: ? — End: 2017-04-24

## 2017-04-24 MED ORDER — HYDROCODONE-ACETAMINOPHEN 5-325 MG PO TABS
1.00 | ORAL_TABLET | ORAL | Status: DC
Start: ? — End: 2017-04-24

## 2017-04-24 MED ORDER — TETANUS-DIPHTH-ACELL PERTUSSIS 5-2.5-18.5 LF-MCG/0.5 IM SUSP
.50 mL | INTRAMUSCULAR | Status: DC
Start: ? — End: 2017-04-24

## 2017-04-24 MED ORDER — MORPHINE SULFATE (PF) 2 MG/ML IV SOLN
2.00 mg | INTRAVENOUS | Status: DC
Start: ? — End: 2017-04-24

## 2017-04-24 MED ORDER — BISACODYL 10 MG RE SUPP
10.00 mg | RECTAL | Status: DC
Start: ? — End: 2017-04-24

## 2017-04-24 MED ORDER — GENERIC EXTERNAL MEDICATION
Status: DC
Start: ? — End: 2017-04-24

## 2017-04-24 MED ORDER — BENZOCAINE-MENTHOL 20-0.5 % EX AERO
INHALATION_SPRAY | CUTANEOUS | Status: DC
Start: ? — End: 2017-04-24

## 2017-04-24 MED ORDER — DIPHENHYDRAMINE HCL 25 MG PO CAPS
25.00 mg | ORAL_CAPSULE | ORAL | Status: DC
Start: ? — End: 2017-04-24

## 2017-04-24 MED ORDER — EPHEDRINE SULFATE 50 MG/ML IJ SOLN
5.00 mg | INTRAMUSCULAR | Status: DC
Start: ? — End: 2017-04-24

## 2017-04-24 MED ORDER — MAGNESIUM HYDROXIDE 400 MG/5ML PO SUSP
30.00 mL | ORAL | Status: DC
Start: ? — End: 2017-04-24

## 2017-04-24 MED ORDER — KETOROLAC TROMETHAMINE 30 MG/ML IJ SOLN
30.00 mg | INTRAMUSCULAR | Status: DC
Start: ? — End: 2017-04-24

## 2017-04-24 MED ORDER — MORPHINE SULFATE (PF) 2 MG/ML IV SOLN
4.00 mg | INTRAVENOUS | Status: DC
Start: ? — End: 2017-04-24

## 2017-04-24 MED ORDER — SODIUM CHLORIDE 0.65 % NA SOLN
2.00 | NASAL | Status: DC
Start: ? — End: 2017-04-24

## 2017-04-24 MED ORDER — GENERIC EXTERNAL MEDICATION
1.00 | Status: DC
Start: ? — End: 2017-04-24

## 2017-04-24 MED ORDER — BUTORPHANOL TARTRATE 1 MG/ML IJ SOLN
1.00 mg | INTRAMUSCULAR | Status: DC
Start: ? — End: 2017-04-24

## 2017-04-24 MED ORDER — GENERIC EXTERNAL MEDICATION
0.50 | Status: DC
Start: ? — End: 2017-04-24

## 2017-04-24 MED ORDER — IBUPROFEN 800 MG PO TABS
800.00 mg | ORAL_TABLET | ORAL | Status: DC
Start: 2017-04-24 — End: 2017-04-24

## 2017-04-24 MED ORDER — TRAMADOL HCL 50 MG PO TABS
100.00 mg | ORAL_TABLET | ORAL | Status: DC
Start: ? — End: 2017-04-24

## 2017-04-24 MED ORDER — ZOLPIDEM TARTRATE 5 MG PO TABS
10.00 mg | ORAL_TABLET | ORAL | Status: DC
Start: ? — End: 2017-04-24

## 2017-04-24 MED ORDER — ALUMINUM-MAGNESIUM-SIMETHICONE 200-200-20 MG/5ML PO SUSP
30.00 mL | ORAL | Status: DC
Start: ? — End: 2017-04-24

## 2017-04-24 MED ORDER — ACETAMINOPHEN 325 MG PO TABS
650.00 mg | ORAL_TABLET | ORAL | Status: DC
Start: ? — End: 2017-04-24

## 2017-04-24 MED ORDER — GENERIC EXTERNAL MEDICATION
.05 | Status: DC
Start: ? — End: 2017-04-24

## 2017-04-24 MED ORDER — GUAIFENESIN 100 MG/5ML PO SYRP
200.00 mg | ORAL_SOLUTION | ORAL | Status: DC
Start: ? — End: 2017-04-24

## 2017-04-27 ENCOUNTER — Ambulatory Visit (HOSPITAL_COMMUNITY): Payer: Medicaid Other

## 2017-05-20 ENCOUNTER — Encounter (HOSPITAL_BASED_OUTPATIENT_CLINIC_OR_DEPARTMENT_OTHER): Payer: Self-pay | Admitting: *Deleted

## 2017-05-20 ENCOUNTER — Emergency Department (HOSPITAL_BASED_OUTPATIENT_CLINIC_OR_DEPARTMENT_OTHER)
Admission: EM | Admit: 2017-05-20 | Discharge: 2017-05-21 | Disposition: A | Discharge status: Home / Self Care | Payer: Medicaid Other | Attending: Emergency Medicine | Admitting: Emergency Medicine

## 2017-05-20 ENCOUNTER — Other Ambulatory Visit: Payer: Self-pay

## 2017-05-20 DIAGNOSIS — J45909 Unspecified asthma, uncomplicated: Secondary | ICD-10-CM | POA: Insufficient documentation

## 2017-05-20 DIAGNOSIS — Z79899 Other long term (current) drug therapy: Secondary | ICD-10-CM | POA: Insufficient documentation

## 2017-05-20 DIAGNOSIS — F17210 Nicotine dependence, cigarettes, uncomplicated: Secondary | ICD-10-CM | POA: Diagnosis not present

## 2017-05-20 DIAGNOSIS — F4320 Adjustment disorder, unspecified: Secondary | ICD-10-CM | POA: Diagnosis not present

## 2017-05-20 DIAGNOSIS — F419 Anxiety disorder, unspecified: Secondary | ICD-10-CM | POA: Diagnosis present

## 2017-05-20 DIAGNOSIS — F4321 Adjustment disorder with depressed mood: Secondary | ICD-10-CM

## 2017-05-20 MED ORDER — LORAZEPAM 1 MG PO TABS: 1.0000 mg | ORAL_TABLET | Freq: Once | ORAL | Status: DC

## 2017-05-20 MED ORDER — LORAZEPAM 1 MG PO TABS
0.5000 mg | ORAL_TABLET | Freq: Once | ORAL | Status: AC
  Administered 2017-05-21: 0.5 mg via ORAL
  Filled 2017-05-20: qty 1
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
## 2017-05-21 NOTE — ED Notes (Signed)
Remains sleepy, NAD, calm, interactive, resps e/u, no dyspnea, skin W&D.

## 2017-07-08 IMAGING — CR DG KNEE COMPLETE 4+V*L*
4 series · 4 of 4 positions shown · non-contrast
Comparison: 06/15/2015

CLINICAL DATA: Pt fell 2 days ago while playing with daughter, pain
left knee, pt unable to stand for exam due to pain, old knee
protocol used, best obtainable

EXAM:
LEFT KNEE - COMPLETE 4+ VIEW

[knee ap]
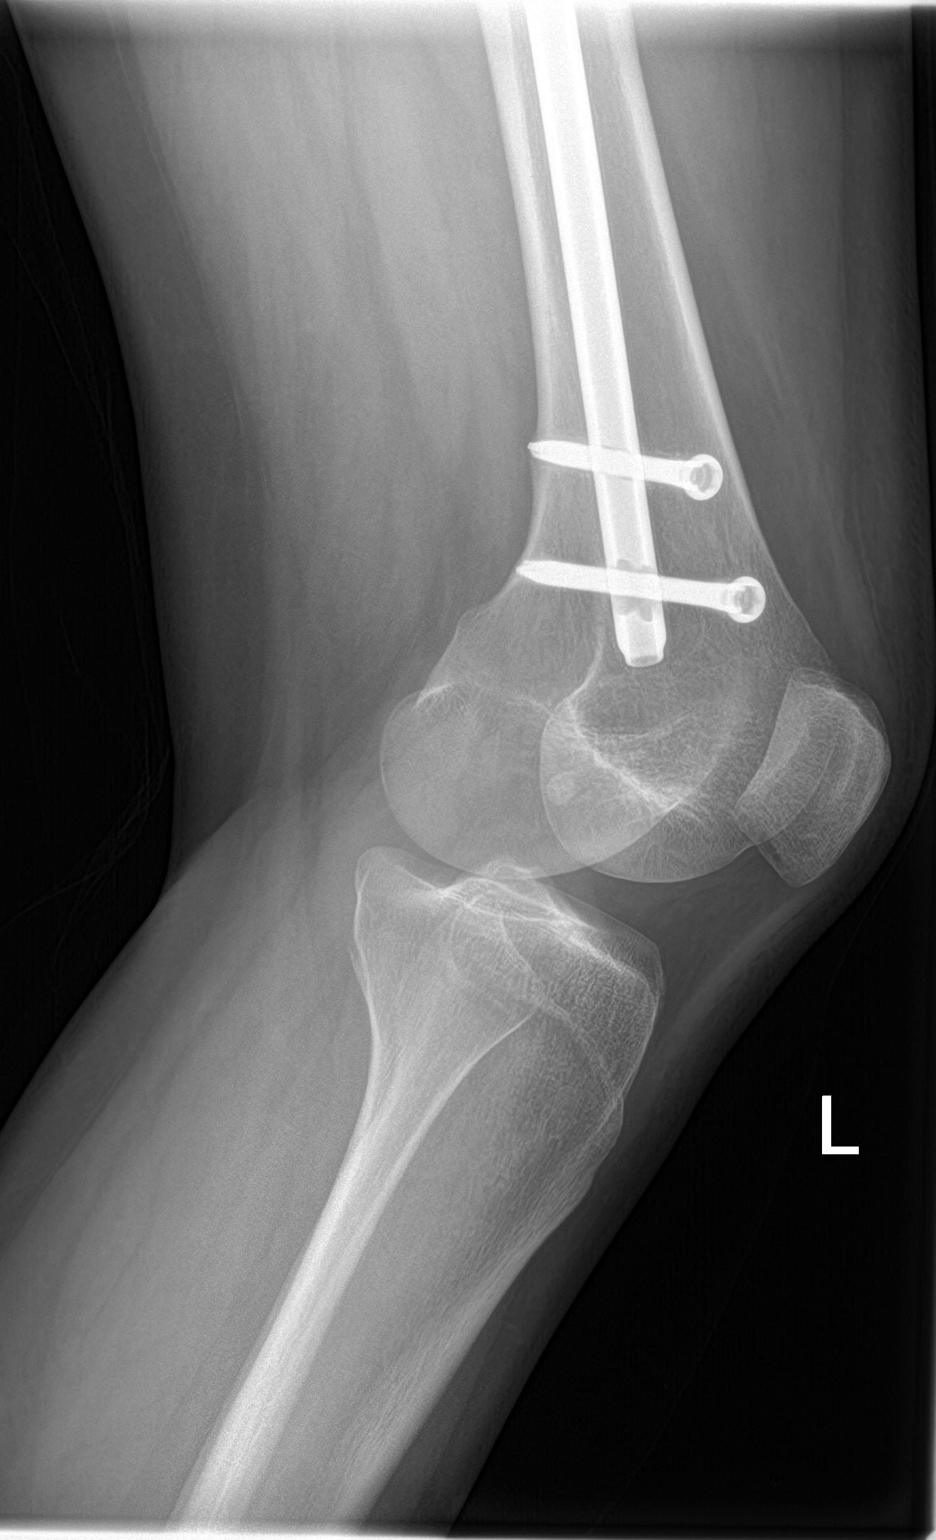

[tunnel]
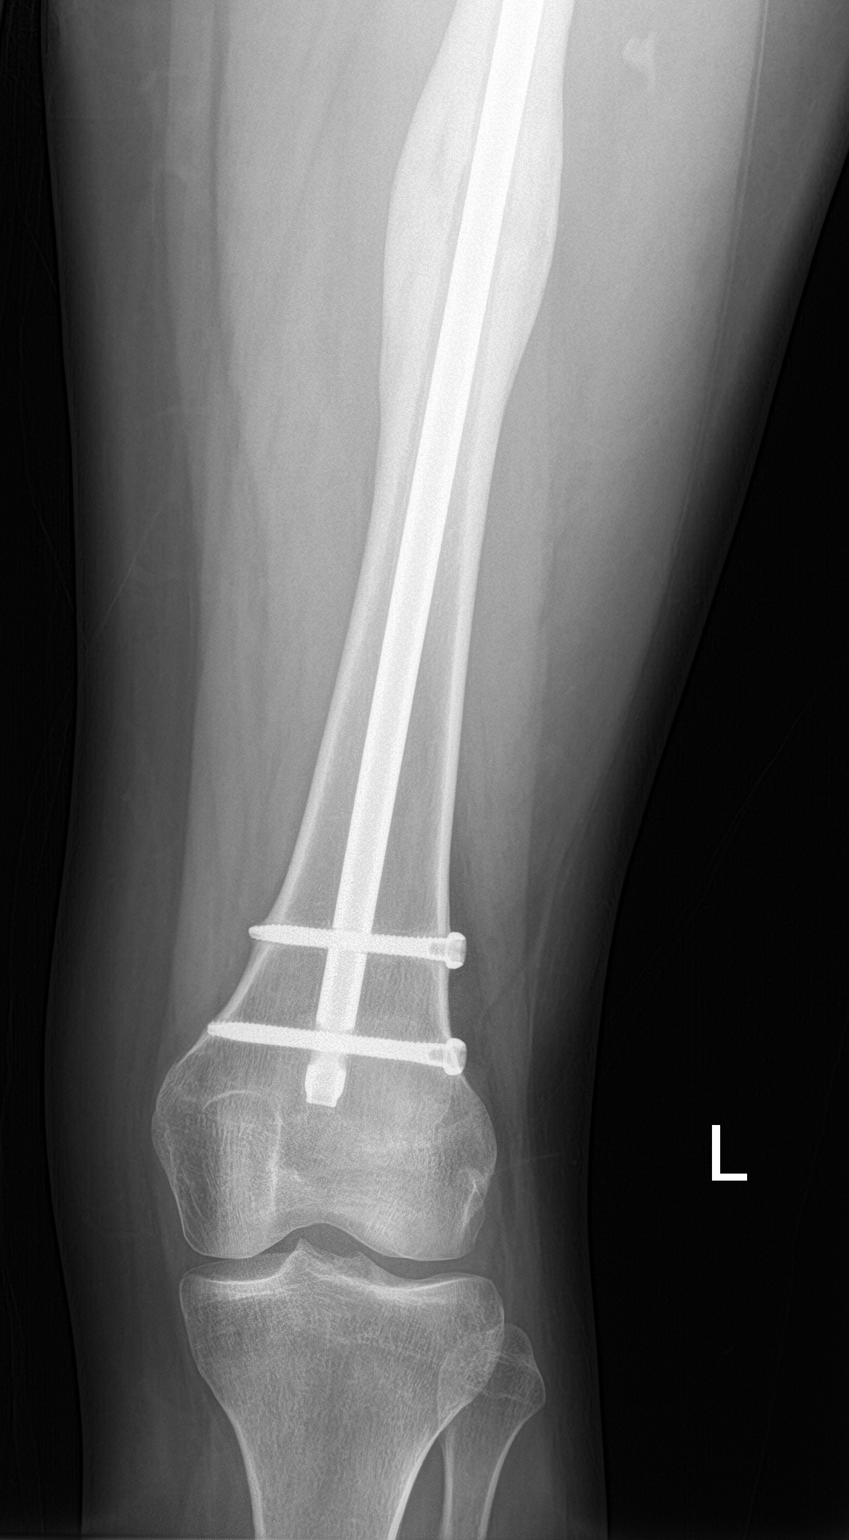

[knee lat]
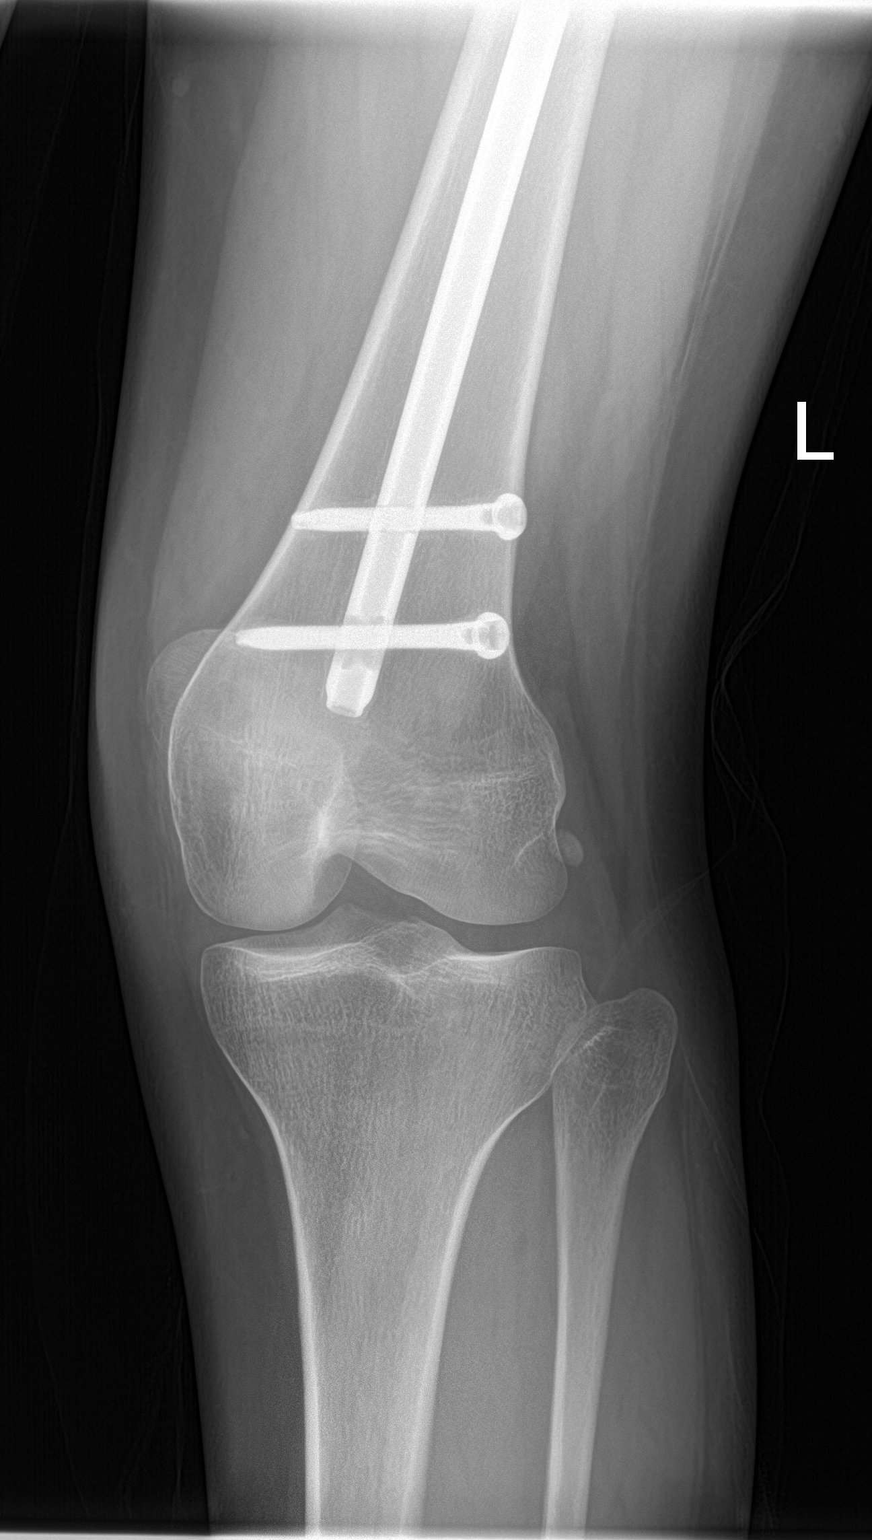

[knee sunrise]
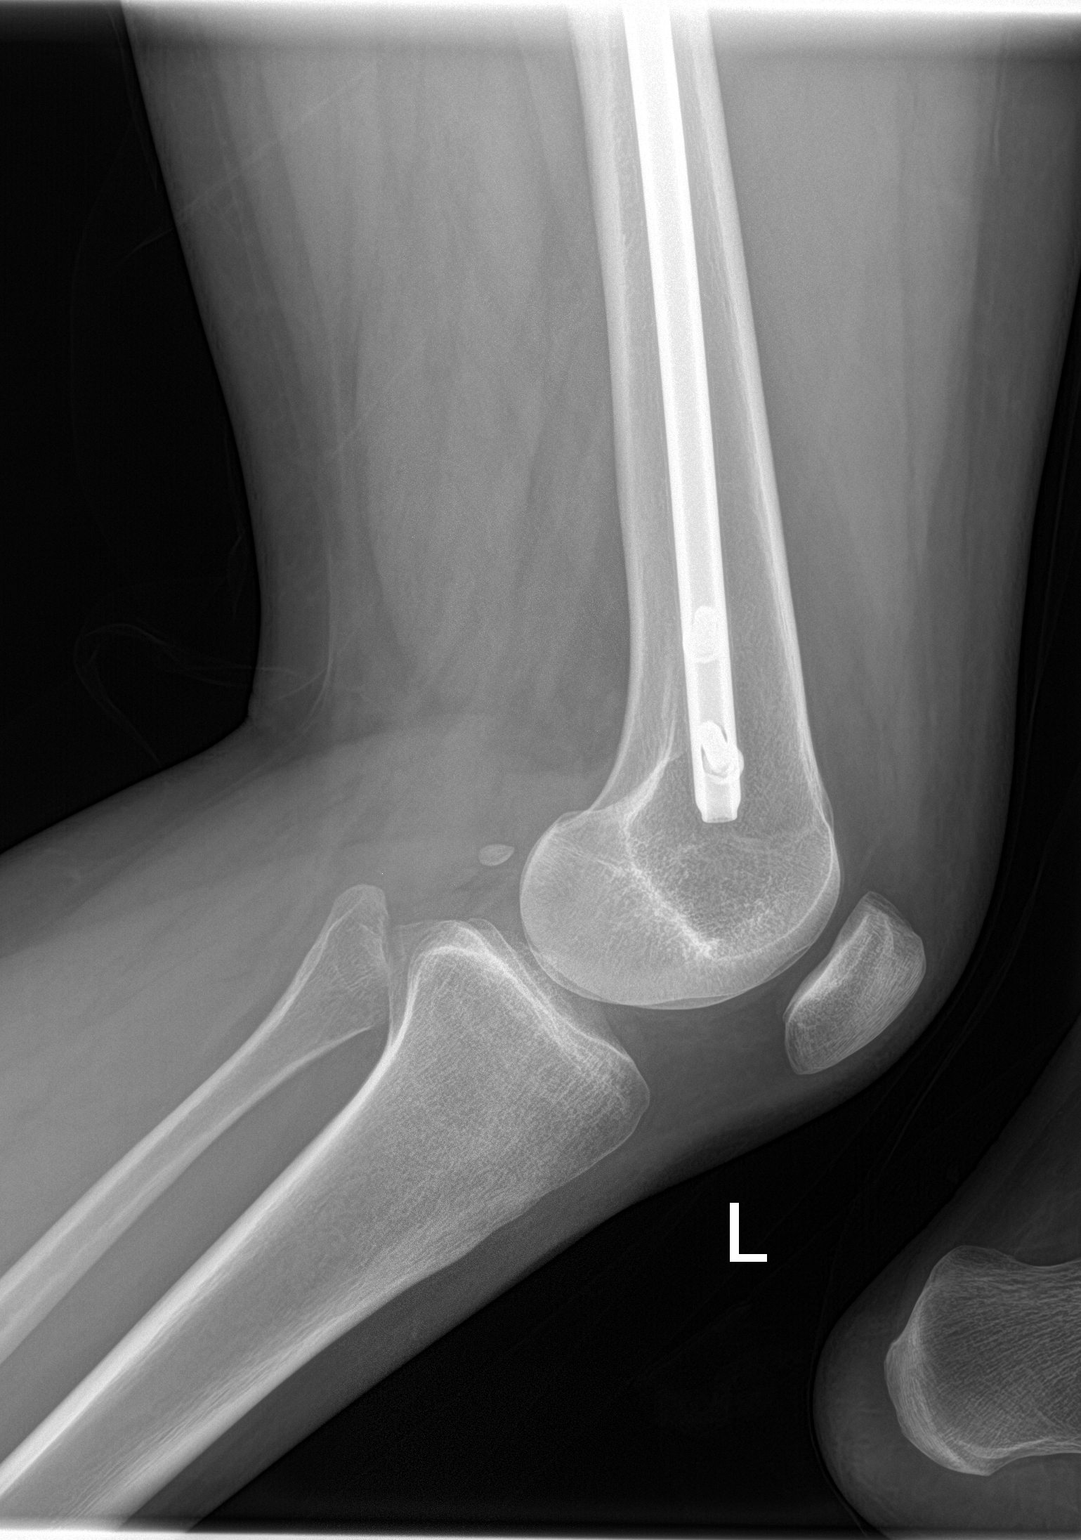

[4 of 4 positions shown; findings below may reference images not displayed]

FINDINGS: Two patient has had ORIF of the left femur. Cortical screws traverse
the intra medullary rod at the knee. There is no evidence for acute
fracture or dislocation. No definite joint effusion identified.
IMPRESSION: 1. Postoperative changes.
2.  No evidence for acute  abnormality.

## 2017-07-08 IMAGING — CR DG HIP (WITH OR WITHOUT PELVIS) INFANT 2-3V*L*
3 series · 3 of 3 positions shown · non-contrast
Comparison: None.

CLINICAL DATA: Fall 2 days ago.  Left hip pain.

EXAM:
DG HIP (WITH OR WITHOUT PELVIS) INFANT 2-3V LEFT

[pelvis ap]
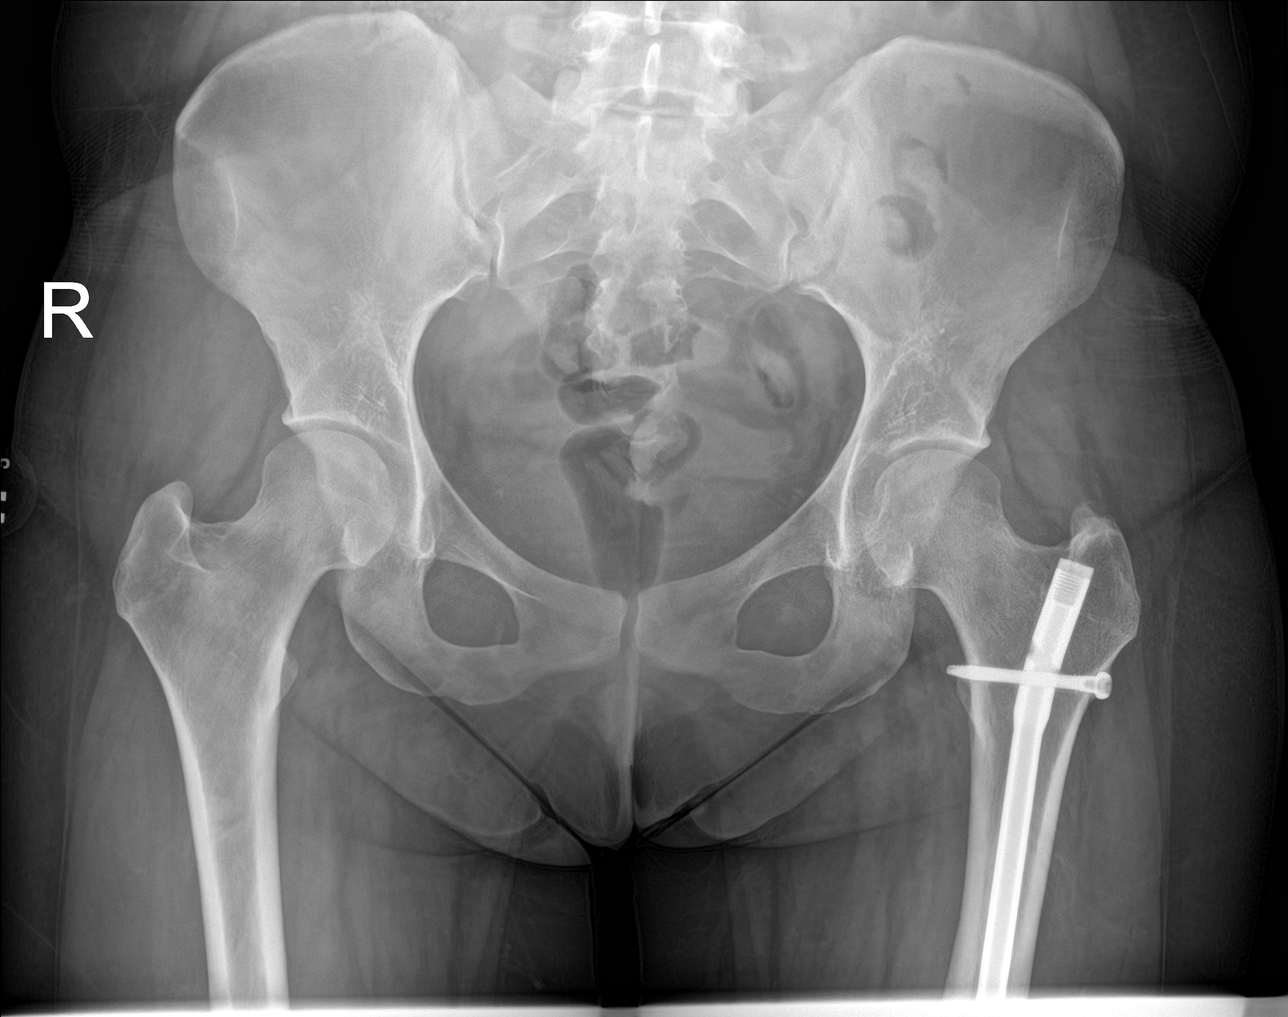

[hip ap]
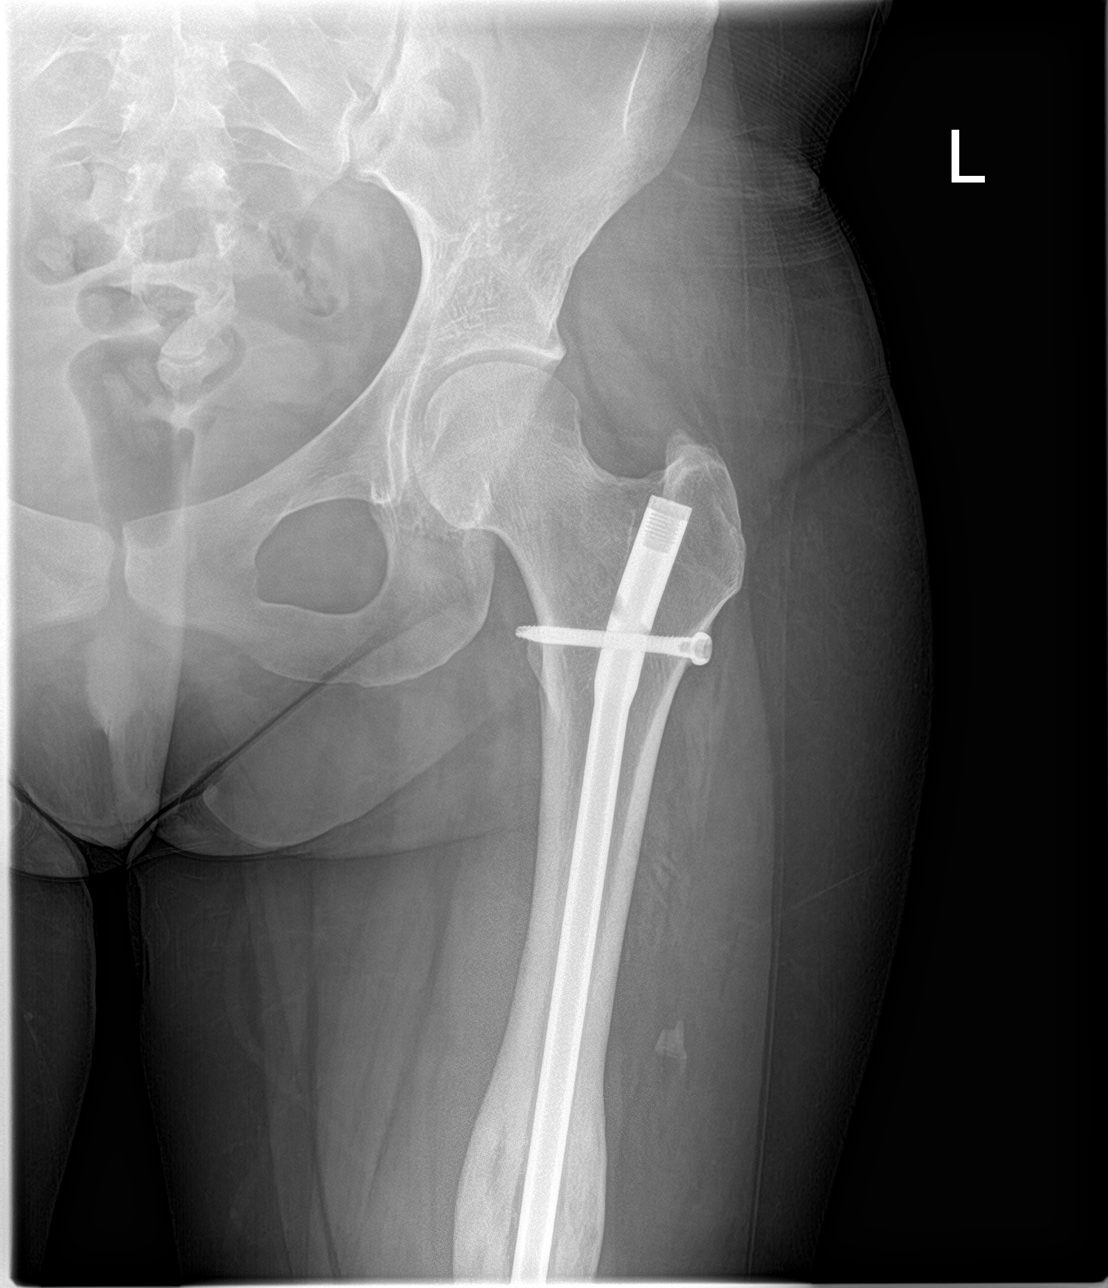

[hip lat]
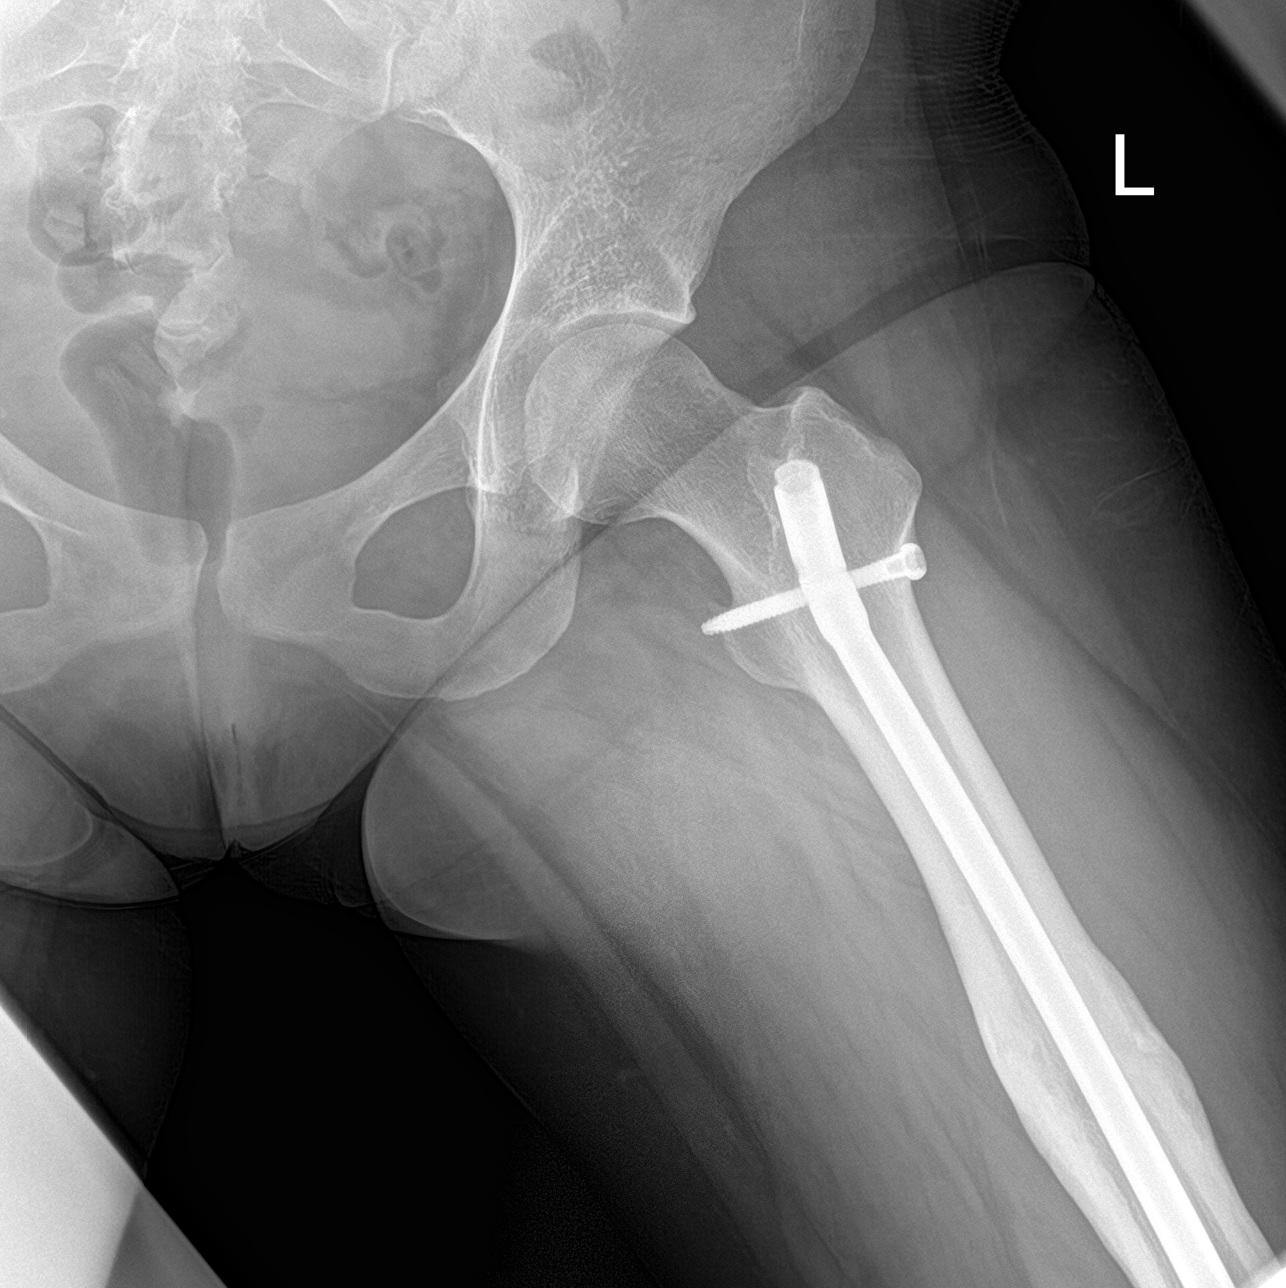

[3 of 3 positions shown; findings below may reference images not displayed]

FINDINGS: Antegrade left femoral nail noted with proximal interlocking screw.
The nail traverses a region of cortical thickening in the femoral
diaphysis likely the site of an old fracture. The distal extent of
the long nail is not included on today's pelvis and hip radiographs.

No lucency along the hardware. Articular spaces in the hip joints
are maintained. I do not observe a fracture or an acute bony
finding.
IMPRESSION: 1. No abnormality identified.

## 2018-07-13 ENCOUNTER — Emergency Department (HOSPITAL_BASED_OUTPATIENT_CLINIC_OR_DEPARTMENT_OTHER)
Admission: EM | Admit: 2018-07-13 | Discharge: 2018-07-13 | Disposition: A | Discharge status: Home / Self Care | Payer: Medicaid Other | Attending: Emergency Medicine | Admitting: Emergency Medicine

## 2018-07-13 ENCOUNTER — Encounter (HOSPITAL_BASED_OUTPATIENT_CLINIC_OR_DEPARTMENT_OTHER): Payer: Self-pay

## 2018-07-13 ENCOUNTER — Other Ambulatory Visit: Payer: Self-pay

## 2018-07-13 DIAGNOSIS — J45909 Unspecified asthma, uncomplicated: Secondary | ICD-10-CM | POA: Diagnosis not present

## 2018-07-13 DIAGNOSIS — F17210 Nicotine dependence, cigarettes, uncomplicated: Secondary | ICD-10-CM | POA: Insufficient documentation

## 2018-07-13 DIAGNOSIS — R21 Rash and other nonspecific skin eruption: Secondary | ICD-10-CM | POA: Diagnosis present

## 2018-07-13 MED ORDER — CEPHALEXIN 500 MG PO CAPS: 500.0000 mg | ORAL_CAPSULE | Freq: Four times a day (QID) | ORAL | 0 refills | Status: AC

## 2018-07-13 MED ORDER — GABAPENTIN 300 MG PO CAPS: 300.0000 mg | ORAL_CAPSULE | Freq: Three times a day (TID) | ORAL | 0 refills | Status: DC

## 2018-07-13 MED ORDER — VALACYCLOVIR HCL 1 G PO TABS: 1000.0000 mg | ORAL_TABLET | Freq: Two times a day (BID) | ORAL | 0 refills | Status: AC

## 2018-07-13 MED FILL — CEPHALEXIN 500 MG CAPSULE: 500 | 5 days supply | Qty: 20 | Fill #0

## 2018-07-13 MED FILL — GABAPENTIN 300 MG CAPSULE: 300 | 30 days supply | Qty: 90 | Fill #0

## 2018-07-13 NOTE — ED Triage Notes (Addendum)
C/o right buttock ?insect bite/?abscess x 2 days-also c/o pain to right groin x 2 days-also requesting refill of gabapentin-NAD-steady gait

## 2018-07-13 NOTE — Discharge Instructions (Addendum)
Take Valtrex and Keflex until completed.  Follow-up on your herpes simplex virus testing on MyChart.  If positive, please follow-up with the health department for further outbreaks.  Take gabapentin 1 once daily for 3 days, twice daily for 3 days, and then you can resume your 3 times a day dosing. Please return to the emergency department if you develop any new or worsening symptoms.
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
## 2018-07-15 LAB — HSV CULTURE AND TYPING

## 2018-07-16 MED FILL — VALACYCLOVIR HCL 500 MG TAB: 500 | 30 days supply | Qty: 30 | Fill #0

## 2018-08-20 MED FILL — VALACYCLOVIR HCL 500 MG TAB: 500 | 30 days supply | Qty: 30 | Fill #1

## 2018-08-27 ENCOUNTER — Encounter (HOSPITAL_BASED_OUTPATIENT_CLINIC_OR_DEPARTMENT_OTHER): Payer: Self-pay | Admitting: Emergency Medicine

## 2018-08-27 ENCOUNTER — Emergency Department (HOSPITAL_BASED_OUTPATIENT_CLINIC_OR_DEPARTMENT_OTHER)
Admission: EM | Admit: 2018-08-27 | Discharge: 2018-08-27 | Disposition: A | Discharge status: Home / Self Care | Payer: Medicaid Other | Attending: Emergency Medicine | Admitting: Emergency Medicine

## 2018-08-27 ENCOUNTER — Other Ambulatory Visit: Payer: Self-pay

## 2018-08-27 DIAGNOSIS — F17210 Nicotine dependence, cigarettes, uncomplicated: Secondary | ICD-10-CM | POA: Insufficient documentation

## 2018-08-27 DIAGNOSIS — Z760 Encounter for issue of repeat prescription: Secondary | ICD-10-CM | POA: Insufficient documentation

## 2018-08-27 DIAGNOSIS — M79605 Pain in left leg: Secondary | ICD-10-CM | POA: Insufficient documentation

## 2018-08-27 DIAGNOSIS — F419 Anxiety disorder, unspecified: Secondary | ICD-10-CM | POA: Diagnosis not present

## 2018-08-27 DIAGNOSIS — J45909 Unspecified asthma, uncomplicated: Secondary | ICD-10-CM | POA: Diagnosis not present

## 2018-08-27 DIAGNOSIS — F1111 Opioid abuse, in remission: Secondary | ICD-10-CM | POA: Insufficient documentation

## 2018-08-27 DIAGNOSIS — Z79899 Other long term (current) drug therapy: Secondary | ICD-10-CM | POA: Insufficient documentation

## 2018-08-27 MED ORDER — GABAPENTIN 300 MG PO CAPS: 300.0000 mg | ORAL_CAPSULE | Freq: Three times a day (TID) | ORAL | 0 refills | Status: AC

## 2018-08-27 MED FILL — GABAPENTIN 300 MG CAPSULE: 300 | 30 days supply | Qty: 90 | Fill #0
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
## 2018-08-27 NOTE — Discharge Instructions (Signed)
Please read and follow all provided instructions.  Your diagnoses today include:  1. Medication refill   2. Left leg pain     Tests performed today include:  Vital signs. See below for your results today.   Medications prescribed:   None  Home care instructions:  Follow any educational materials contained in this packet.  Follow-up instructions: Please follow-up with your primary care provider as needed for further evaluation of your symptoms.  Return instructions:   Please return to the Emergency Department if you experience worsening symptoms.   Please return if you have any other emergent concerns.  Additional Information:  Your vital signs today were: BP (!) 125/99 (BP Location: Left Arm)    Pulse 73    Temp 98 F (36.7 C) (Oral)    Resp 18    Ht 5\' 2"  (1.575 m)    Wt 68 kg    LMP 01/29/2018 (Approximate)    SpO2 100%    Breastfeeding No    BMI 27.44 kg/m  If your blood pressure (BP) was elevated above 135/85 this visit, please have this repeated by your doctor within one month. ---------------

## 2018-08-27 NOTE — ED Provider Notes (Signed)
[facility] EMERGENCY DEPARTMENT Provider Note   CSN: [id number] Arrival date & time: 08/27/18  1355    History   Chief Complaint Chief Complaint  Patient presents with  . Leg Pain    HPI Jessica Mercer is a 28 y.o. female.     Patient with history of substance abuse, chronic restless leg and left lower extremity discomfort treated with gabapentin --presents requesting refill of gabapentin.  Patient states that she is currently without Medicaid.  She states that she is trying to have this reinstated.  She states that she fell recently and this exacerbated her symptoms.  She is able to walk.  She has pain over the lateral portion of the knee which is chronic for her.  She states that symptoms are the same as when her discomfort is flared in the past.  She denies any distal numbness or tingling.  No hip or ankle pain.  No skin changes.  Onset of symptoms acute.  Course is constant.     Past Medical History:  Diagnosis Date  . Anxiety   . Asthma   . Frequent UTI   . History of heroin abuse (HCC)   . Opioid abuse, in remission (HCC)   . PTSD (post-traumatic stress disorder)     Patient Active Problem List   Diagnosis Date Noted  . HEMATURIA UNSPECIFIED 02/07/2011  . PELVIC INFLAMMATORY DISEASE, ACUTE 02/07/2011  . ABDOMINAL PAIN, ACUTE 02/07/2011    Past Surgical History:  Procedure Laterality Date  . FEMUR CLOSED REDUCTION       OB History    Gravida  3   Para  1   Term  1   Preterm      AB  1   Living  1     SAB      TAB  1   Ectopic      Multiple      Live Births               Home Medications    Prior to Admission medications   Medication Sig Start Date End Date Taking? Authorizing Provider  ALPRAZolam Prudy Feeler) 1 MG tablet Take 0.5-1 mg by mouth 2 (two) times daily.  08/14/14   [provider]  Buprenorphine HCl (SUBUTEX SL) Place under the tongue.    [provider]  cephALEXin (KEFLEX) 500 MG capsule Take  1 capsule (500 mg total) by mouth 4 (four) times daily. 07/13/18   Law, Waylan Boga, PA-C  cyclobenzaprine (FLEXERIL) 5 MG tablet Take 1 tablet (5 mg total) by mouth 2 (two) times daily as needed for muscle spasms. 09/25/15   Tilden Fossa, MD  gabapentin (NEURONTIN) 300 MG capsule Take 1 capsule (300 mg total) by mouth 3 (three) times daily. 08/27/18   Renne Crigler, PA-C  IRON PO Take by mouth.    [provider]  Prenatal Vit-Fe Fumarate-FA (PRENATAL VITAMIN PO) Take by mouth.    [provider]    Family History History reviewed. No pertinent family history.  Social History Social History   Tobacco Use  . Smoking status: Current Every Day Smoker    Types: Cigarettes  . Smokeless tobacco: Never Used  Substance Use Topics  . Alcohol use: Not Currently    Comment: occ  . Drug use: Not Currently     Allergies   Amoxicillin and Penicillins   Review of Systems Review of Systems  Constitutional: Negative for activity change.  Musculoskeletal: Positive for arthralgias. Negative for  back pain, joint swelling and neck pain.  Skin: Negative for wound.  Neurological: Negative for weakness and numbness.     Physical Exam Updated Vital Signs BP (!) 125/99 (BP Location: Left Arm)   Pulse 73   Temp 98 F (36.7 C) (Oral)   Resp 18   Ht 5\' 2"  (1.575 m)   Wt 68 kg   LMP 01/29/2018 (Approximate)   SpO2 100%   Breastfeeding No   BMI 27.44 kg/m   Physical Exam Vitals signs and nursing note reviewed.  Constitutional:      Appearance: She is well-developed.  HENT:     Head: Normocephalic and atraumatic.  Eyes:     Pupils: Pupils are equal, round, and reactive to light.  Neck:     Musculoskeletal: Normal range of motion and neck supple.  Cardiovascular:     Pulses: Normal pulses. No decreased pulses.          Dorsalis pedis pulses are 2+ on the left side.  Musculoskeletal:        General: Tenderness present.     Left hip: Normal.     Left knee: She  exhibits normal range of motion, no swelling and no effusion. Tenderness found. Lateral joint line tenderness noted.     Left ankle: Normal.     Left upper leg: Normal.     Left lower leg: Normal.       Legs:  Skin:    General: Skin is warm and dry.  Neurological:     Mental Status: She is alert.     Sensory: No sensory deficit.     Comments: Motor, sensation, and vascular distal to the injury is fully intact.       ED Treatments / Results  Labs (all labs ordered are listed, but only abnormal results are displayed) Labs Reviewed - No data to display  EKG None  Radiology No results found.  Procedures Procedures (including critical care time)  Medications Ordered in ED Medications - No data to display   Initial Impression / Assessment and Plan / ED Course  I have reviewed the triage vital signs and the nursing notes.  Pertinent labs & imaging results that were available during my care of the patient were reviewed by me and considered in my medical decision making (see chart for details).        Patient seen and examined.  Medication refill provided.  Patient encouraged to follow-up with PCP.  Discussed utility of x-ray at this point.  Patient and I agree that she does not need one at this time given that her symptoms are not worse than her typical flares and she is ambulatory without any dysfunction.  Encouraged follow-up if symptoms persist for imaging of possible minor fracture.  Vital signs reviewed and are as follows: BP (!) 125/99 (BP Location: Left Arm)   Pulse 73   Temp 98 F (36.7 C) (Oral)   Resp 18   Ht 5\' 2"  (1.575 m)   Wt 68 kg   LMP 01/29/2018 (Approximate)   SpO2 100%   Breastfeeding No   BMI 27.44 kg/m     Final Clinical Impressions(s) / ED Diagnoses   Final diagnoses:  Medication refill  Left leg pain   Patient with chronic left leg discomfort and restless leg after previous injury and surgery.  Patient had a fall that exacerbated  symptoms recently.  Functionally she is at her baseline.  No objective neurovascular compromise on exam.  Refill provided.  Patient strongly encouraged follow-up with PCP.  ED Discharge Orders         Ordered    gabapentin (NEURONTIN) 300 MG capsule  3 times daily     08/27/18 1430           Renne Crigler, PA-C 08/27/18 1436    Azalia Bilis, MD 08/28/18 [phone]

## 2019-04-24 MED FILL — VALACYCLOVIR HCL 500 MG TAB: 500 | 30 days supply | Qty: 30 | Fill #0

## 2019-11-25 ENCOUNTER — Emergency Department (HOSPITAL_BASED_OUTPATIENT_CLINIC_OR_DEPARTMENT_OTHER)
Admission: EM | Admit: 2019-11-25 | Discharge: 2019-11-25 | Disposition: A | Discharge status: Home / Self Care | Payer: Medicaid Other | Attending: Emergency Medicine | Admitting: Emergency Medicine

## 2019-11-25 ENCOUNTER — Encounter (HOSPITAL_BASED_OUTPATIENT_CLINIC_OR_DEPARTMENT_OTHER): Payer: Self-pay | Admitting: Emergency Medicine

## 2019-11-25 ENCOUNTER — Other Ambulatory Visit: Payer: Self-pay

## 2019-11-25 DIAGNOSIS — Y9289 Other specified places as the place of occurrence of the external cause: Secondary | ICD-10-CM | POA: Insufficient documentation

## 2019-11-25 DIAGNOSIS — L538 Other specified erythematous conditions: Secondary | ICD-10-CM | POA: Diagnosis not present

## 2019-11-25 DIAGNOSIS — Y9389 Activity, other specified: Secondary | ICD-10-CM | POA: Diagnosis not present

## 2019-11-25 DIAGNOSIS — W57XXXA Bitten or stung by nonvenomous insect and other nonvenomous arthropods, initial encounter: Secondary | ICD-10-CM | POA: Insufficient documentation

## 2019-11-25 DIAGNOSIS — S80861A Insect bite (nonvenomous), right lower leg, initial encounter: Secondary | ICD-10-CM | POA: Diagnosis not present

## 2019-11-25 DIAGNOSIS — F17210 Nicotine dependence, cigarettes, uncomplicated: Secondary | ICD-10-CM | POA: Insufficient documentation

## 2019-11-25 DIAGNOSIS — J45909 Unspecified asthma, uncomplicated: Secondary | ICD-10-CM | POA: Diagnosis not present

## 2019-11-25 DIAGNOSIS — Y999 Unspecified external cause status: Secondary | ICD-10-CM | POA: Insufficient documentation

## 2019-11-25 DIAGNOSIS — S80862A Insect bite (nonvenomous), left lower leg, initial encounter: Secondary | ICD-10-CM | POA: Insufficient documentation

## 2019-11-25 MED ORDER — HYDROCORTISONE 1 % EX CREA: TOPICAL_CREAM | CUTANEOUS | 0 refills | Status: DC

## 2019-11-25 MED ORDER — DIPHENHYDRAMINE HCL 25 MG PO CAPS
25.0000 mg | ORAL_CAPSULE | Freq: Once | ORAL | Status: AC
  Administered 2019-11-25: 25 mg via ORAL
  Filled 2019-11-25: qty 1

## 2019-11-25 MED ORDER — HYDROCORTISONE 1 % EX CREA: TOPICAL_CREAM | CUTANEOUS | 0 refills | Status: AC

## 2019-11-25 MED ORDER — FAMOTIDINE 20 MG PO TABS
20.0000 mg | ORAL_TABLET | Freq: Once | ORAL | Status: AC
  Administered 2019-11-25: 20 mg via ORAL
  Filled 2019-11-25: qty 1

## 2019-11-25 MED ORDER — DIPHENHYDRAMINE HCL 25 MG PO TABS: 25.0000 mg | ORAL_TABLET | Freq: Four times a day (QID) | ORAL | 0 refills | Status: AC

## 2019-11-25 MED ORDER — DIPHENHYDRAMINE HCL 25 MG PO TABS: 25.0000 mg | ORAL_TABLET | Freq: Four times a day (QID) | ORAL | 0 refills | Status: DC

## 2019-11-25 MED ORDER — FAMOTIDINE 20 MG PO TABS: 20.0000 mg | ORAL_TABLET | Freq: Every day | ORAL | 0 refills | Status: DC

## 2019-11-25 MED ORDER — FAMOTIDINE 20 MG PO TABS: 20.0000 mg | ORAL_TABLET | Freq: Every day | ORAL | 0 refills | Status: AC

## 2019-11-25 MED FILL — BANOPHEN 25 MG CAPSULE: 25 | 25 days supply | Qty: 100 | Fill #0

## 2019-11-25 MED FILL — FAMOTIDINE 20 MG TABS: 20 | 30 days supply | Qty: 30 | Fill #0

## 2019-11-25 MED FILL — HYDROCORTISONE 1% CREAM: 1 | 30 days supply | Qty: 28 | Fill #0

## 2019-11-25 NOTE — ED Triage Notes (Signed)
Pt reports itching in various spots on her body, but mostly on her lower legs, starting this morning; pt reports swollen and painful feet also starting this morning; pt reports seeing a "flea like" bug on her and had been outdoors this AM

## 2019-11-25 NOTE — Discharge Instructions (Signed)
Use the cream as prescribed and take the antihistamines as needed for itching.  Make sure you clean your bed linens and clothes in hot water.  Consult an exterminator for the bug problem in your house.  Return to the ED if you develop difficulty breathing, chest pain, or other concerns.

## 2019-11-25 NOTE — ED Provider Notes (Signed)
[facility] EMERGENCY DEPARTMENT Provider Note   CSN: [id number] Arrival date & time: 11/25/19  0555     History Chief Complaint  Patient presents with  . Foot Swelling  . Insect Bite    Jessica Mercer is a 29 y.o. female.  Patient concerned that she was "attacked by fleas".  States symptoms started after she was out in the grass last night.  She believes she was bitten by some type of insect and is having itching, pain and swelling to her lower extremities.  States the itching and redness has improved but she still having some itching.  There is no difficulty breathing or difficulty swallowing.  No chest pain or shortness of breath.  No abdominal pain, nausea or vomiting.  States they have a "flea problem in their house" and are in the process of getting an exterminator.  She feels this is a different issue though and she is still having itching despite taking a shower at home. Significant other at bedside does have similar symptoms. No tongue or lip swelling. No difficulty swallowing or difficulty breathing. 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  States she is no longer on buprenorphine.  The history is provided by the patient.       Past Medical History:  Diagnosis Date  . Anxiety   . Asthma   . Frequent UTI   . History of heroin abuse (HCC)   . Opioid abuse, in remission (HCC)   . PTSD (post-traumatic stress disorder)     Patient Active Problem List   Diagnosis Date Noted  . HEMATURIA UNSPECIFIED 02/07/2011  . PELVIC INFLAMMATORY DISEASE, ACUTE 02/07/2011  . ABDOMINAL PAIN, ACUTE 02/07/2011    Past Surgical History:  Procedure Laterality Date  . FEMUR CLOSED REDUCTION       OB History    Gravida  3   Para  1   Term  1   Preterm      AB  1   Living  1     SAB      TAB  1   Ectopic      Multiple      Live Births              No family history on file.  Social History   Tobacco Use  . Smoking status:  Current Every Day Smoker    Types: Cigarettes  . Smokeless tobacco: Never Used  Vaping Use  . Vaping Use: Never used  Substance Use Topics  . Alcohol use: Not Currently    Comment: occ  . Drug use: Not Currently    Home Medications Prior to Admission medications   Medication Sig Start Date End Date Taking? Authorizing Provider  ALPRAZolam Prudy Feeler) 1 MG tablet Take 0.5-1 mg by mouth 2 (two) times daily.  08/14/14   [provider]  Buprenorphine HCl (SUBUTEX SL) Place under the tongue.    [provider]  cephALEXin (KEFLEX) 500 MG capsule Take 1 capsule (500 mg total) by mouth 4 (four) times daily. 07/13/18   Law, Waylan Boga, PA-C  cyclobenzaprine (FLEXERIL) 5 MG tablet Take 1 tablet (5 mg total) by mouth 2 (two) times daily as needed for muscle spasms. 09/25/15   Tilden Fossa, MD  gabapentin (NEURONTIN) 300 MG capsule Take 1 capsule (300 mg total) by mouth 3 (three) times daily. 08/27/18   Renne Crigler, PA-C  IRON PO Take by mouth.    [provider]  Prenatal  Vit-Fe Fumarate-FA (PRENATAL VITAMIN PO) Take by mouth.    [provider]    Allergies    Penicillins and Amoxicillin  Review of Systems   Review of Systems  Constitutional: Negative for activity change, appetite change and fever.  HENT: Negative for congestion and rhinorrhea.   Respiratory: Negative for cough, chest tightness and shortness of breath.   Cardiovascular: Negative for chest pain.  Gastrointestinal: Negative for abdominal pain and vomiting.  Genitourinary: Negative for dysuria and hematuria.  Musculoskeletal: Negative for arthralgias and myalgias.  Skin: Positive for rash.  Neurological: Negative for dizziness, weakness and headaches.   all other systems are negative except as noted in the HPI and PMH.    Physical Exam Updated Vital Signs BP (!) 119/48 (BP Location: Right Arm)   Pulse (!) 106   Temp 97.7 F (36.5 C)   Resp 20   Ht 5\' 2"  (1.575 m)   Wt 61.2 kg    SpO2 100%   BMI 24.69 kg/m   Physical Exam Vitals and nursing note reviewed.  Constitutional:      General: She is not in acute distress.    Appearance: She is well-developed.     Comments: Anxious  HENT:     Head: Normocephalic and atraumatic.     Mouth/Throat:     Pharynx: No oropharyngeal exudate.     Comments: No tongue or lip swelling Eyes:     Conjunctiva/sclera: Conjunctivae normal.     Pupils: Pupils are equal, round, and reactive to light.  Neck:     Comments: No meningismus. Cardiovascular:     Rate and Rhythm: Normal rate and regular rhythm.     Heart sounds: Normal heart sounds. No murmur heard.   Pulmonary:     Effort: Pulmonary effort is normal. No respiratory distress.     Breath sounds: Normal breath sounds.  Chest:     Chest wall: No tenderness.  Abdominal:     Palpations: Abdomen is soft.     Tenderness: There is no abdominal tenderness. There is no guarding or rebound.  Musculoskeletal:        General: No tenderness. Normal range of motion.     Cervical back: Normal range of motion and neck supple.  Skin:    General: Skin is warm.     Findings: Rash present.     Comments: Scattered erythematous papules to her lower extremities.  No significant streaking or erythema.  No fluctuance. Intact DP and PT pulses.  Neurological:     Mental Status: She is alert and oriented to person, place, and time.     Cranial Nerves: No cranial nerve deficit.     Motor: No abnormal muscle tone.     Coordination: Coordination normal.     Comments:  5/5 strength throughout. CN 2-12 intact.Equal grip strength.   Psychiatric:        Behavior: Behavior normal.     ED Results / Procedures / Treatments   Labs (all labs ordered are listed, but only abnormal results are displayed) Labs Reviewed - No data to display  EKG None  Radiology No results found.  Procedures Procedures (including critical care time)  Medications Ordered in ED Medications - No data to  display  ED Course  I have reviewed the triage vital signs and the nursing notes.  Pertinent labs & imaging results that were available during my care of the patient were reviewed by me and considered in my medical decision making (see chart for  details).    MDM Rules/Calculators/A&P                         Bug bites, possibly from fleas, chiggers, bedbugs, or other. No intertriginous lesions suggest scabies.  We will treat with antihistamines and topical steroids.advised to thoroughly launder clothing and bedding. Have exterminator check house.  Follow-up with PCP.  Return to the ED with difficulty breathing, difficulty swallowing, any other concerns Final Clinical Impression(s) / ED Diagnoses Final diagnoses:  Bug bite, initial encounter    Rx / DC Orders ED Discharge Orders    None       Shandiin Eisenbeis, Annie Main, MD 11/25/19 [phone]
